# Patient Record
Sex: Female | Born: 1945 | Race: White | Hispanic: No | Smoking: Former smoker
Health system: Southern US, Community
[De-identification: ages and names within clinical notes are randomized; demographics above are authoritative.]

## PROBLEM LIST (undated history)

## (undated) DIAGNOSIS — I429 Cardiomyopathy, unspecified: Secondary | ICD-10-CM

## (undated) DIAGNOSIS — J9600 Acute respiratory failure, unspecified whether with hypoxia or hypercapnia: Secondary | ICD-10-CM

## (undated) DIAGNOSIS — I219 Acute myocardial infarction, unspecified: Secondary | ICD-10-CM

## (undated) DIAGNOSIS — I10 Essential (primary) hypertension: Secondary | ICD-10-CM

## (undated) DIAGNOSIS — I5020 Unspecified systolic (congestive) heart failure: Secondary | ICD-10-CM

---

## 2015-10-27 DIAGNOSIS — I10 Essential (primary) hypertension: Secondary | ICD-10-CM | POA: Insufficient documentation

## 2015-10-27 DIAGNOSIS — G4730 Sleep apnea, unspecified: Secondary | ICD-10-CM | POA: Insufficient documentation

## 2015-10-27 DIAGNOSIS — F329 Major depressive disorder, single episode, unspecified: Secondary | ICD-10-CM | POA: Insufficient documentation

## 2015-10-27 DIAGNOSIS — G4733 Obstructive sleep apnea (adult) (pediatric): Secondary | ICD-10-CM | POA: Insufficient documentation

## 2015-10-27 DIAGNOSIS — R5383 Other fatigue: Secondary | ICD-10-CM | POA: Insufficient documentation

## 2015-10-27 DIAGNOSIS — R1011 Right upper quadrant pain: Secondary | ICD-10-CM | POA: Insufficient documentation

## 2015-10-27 DIAGNOSIS — E1151 Type 2 diabetes mellitus with diabetic peripheral angiopathy without gangrene: Secondary | ICD-10-CM | POA: Insufficient documentation

## 2015-10-27 DIAGNOSIS — G2581 Restless legs syndrome: Secondary | ICD-10-CM | POA: Insufficient documentation

## 2015-10-27 DIAGNOSIS — E782 Mixed hyperlipidemia: Secondary | ICD-10-CM | POA: Insufficient documentation

## 2015-10-27 DIAGNOSIS — H019 Unspecified inflammation of eyelid: Secondary | ICD-10-CM | POA: Insufficient documentation

## 2015-10-27 DIAGNOSIS — M8580 Other specified disorders of bone density and structure, unspecified site: Secondary | ICD-10-CM | POA: Insufficient documentation

## 2015-10-27 DIAGNOSIS — F32A Depression, unspecified: Secondary | ICD-10-CM | POA: Insufficient documentation

## 2015-10-27 DIAGNOSIS — N3281 Overactive bladder: Secondary | ICD-10-CM | POA: Insufficient documentation

## 2016-01-11 DIAGNOSIS — G4733 Obstructive sleep apnea (adult) (pediatric): Secondary | ICD-10-CM | POA: Diagnosis not present

## 2016-01-11 DIAGNOSIS — G2581 Restless legs syndrome: Secondary | ICD-10-CM | POA: Diagnosis not present

## 2016-09-28 DIAGNOSIS — R0789 Other chest pain: Secondary | ICD-10-CM | POA: Diagnosis not present

## 2016-09-28 DIAGNOSIS — I2109 ST elevation (STEMI) myocardial infarction involving other coronary artery of anterior wall: Secondary | ICD-10-CM | POA: Diagnosis not present

## 2016-09-28 DIAGNOSIS — I10 Essential (primary) hypertension: Secondary | ICD-10-CM | POA: Diagnosis not present

## 2016-09-28 DIAGNOSIS — J069 Acute upper respiratory infection, unspecified: Secondary | ICD-10-CM | POA: Diagnosis not present

## 2016-09-28 DIAGNOSIS — J9601 Acute respiratory failure with hypoxia: Secondary | ICD-10-CM | POA: Diagnosis not present

## 2016-09-28 DIAGNOSIS — R0989 Other specified symptoms and signs involving the circulatory and respiratory systems: Secondary | ICD-10-CM | POA: Diagnosis not present

## 2016-09-28 DIAGNOSIS — R0600 Dyspnea, unspecified: Secondary | ICD-10-CM | POA: Diagnosis not present

## 2016-09-28 DIAGNOSIS — I5023 Acute on chronic systolic (congestive) heart failure: Secondary | ICD-10-CM | POA: Diagnosis not present

## 2016-09-28 DIAGNOSIS — R0602 Shortness of breath: Secondary | ICD-10-CM | POA: Diagnosis not present

## 2016-09-28 DIAGNOSIS — N179 Acute kidney failure, unspecified: Secondary | ICD-10-CM | POA: Diagnosis not present

## 2016-09-28 DIAGNOSIS — R570 Cardiogenic shock: Secondary | ICD-10-CM | POA: Diagnosis not present

## 2016-09-29 DIAGNOSIS — I5023 Acute on chronic systolic (congestive) heart failure: Secondary | ICD-10-CM | POA: Insufficient documentation

## 2016-09-29 DIAGNOSIS — K529 Noninfective gastroenteritis and colitis, unspecified: Secondary | ICD-10-CM | POA: Diagnosis present

## 2016-09-29 DIAGNOSIS — Z781 Physical restraint status: Secondary | ICD-10-CM | POA: Diagnosis not present

## 2016-09-29 DIAGNOSIS — I509 Heart failure, unspecified: Secondary | ICD-10-CM | POA: Diagnosis not present

## 2016-09-29 DIAGNOSIS — I2510 Atherosclerotic heart disease of native coronary artery without angina pectoris: Secondary | ICD-10-CM | POA: Diagnosis not present

## 2016-09-29 DIAGNOSIS — I361 Nonrheumatic tricuspid (valve) insufficiency: Secondary | ICD-10-CM | POA: Diagnosis not present

## 2016-09-29 DIAGNOSIS — R0600 Dyspnea, unspecified: Secondary | ICD-10-CM | POA: Diagnosis not present

## 2016-09-29 DIAGNOSIS — Z79899 Other long term (current) drug therapy: Secondary | ICD-10-CM | POA: Diagnosis not present

## 2016-09-29 DIAGNOSIS — E785 Hyperlipidemia, unspecified: Secondary | ICD-10-CM | POA: Diagnosis present

## 2016-09-29 DIAGNOSIS — R0602 Shortness of breath: Secondary | ICD-10-CM | POA: Diagnosis not present

## 2016-09-29 DIAGNOSIS — I2109 ST elevation (STEMI) myocardial infarction involving other coronary artery of anterior wall: Secondary | ICD-10-CM | POA: Diagnosis not present

## 2016-09-29 DIAGNOSIS — I2102 ST elevation (STEMI) myocardial infarction involving left anterior descending coronary artery: Secondary | ICD-10-CM | POA: Diagnosis not present

## 2016-09-29 DIAGNOSIS — B1910 Unspecified viral hepatitis B without hepatic coma: Secondary | ICD-10-CM | POA: Diagnosis present

## 2016-09-29 DIAGNOSIS — I447 Left bundle-branch block, unspecified: Secondary | ICD-10-CM | POA: Diagnosis not present

## 2016-09-29 DIAGNOSIS — I252 Old myocardial infarction: Secondary | ICD-10-CM | POA: Diagnosis not present

## 2016-09-29 DIAGNOSIS — I517 Cardiomegaly: Secondary | ICD-10-CM | POA: Diagnosis not present

## 2016-09-29 DIAGNOSIS — Z87891 Personal history of nicotine dependence: Secondary | ICD-10-CM | POA: Diagnosis not present

## 2016-09-29 DIAGNOSIS — E876 Hypokalemia: Secondary | ICD-10-CM | POA: Diagnosis present

## 2016-09-29 DIAGNOSIS — R918 Other nonspecific abnormal finding of lung field: Secondary | ICD-10-CM | POA: Diagnosis not present

## 2016-09-29 DIAGNOSIS — I340 Nonrheumatic mitral (valve) insufficiency: Secondary | ICD-10-CM | POA: Diagnosis not present

## 2016-09-29 DIAGNOSIS — E1159 Type 2 diabetes mellitus with other circulatory complications: Secondary | ICD-10-CM | POA: Diagnosis not present

## 2016-09-29 DIAGNOSIS — N179 Acute kidney failure, unspecified: Secondary | ICD-10-CM | POA: Diagnosis present

## 2016-09-29 DIAGNOSIS — I214 Non-ST elevation (NSTEMI) myocardial infarction: Secondary | ICD-10-CM | POA: Diagnosis not present

## 2016-09-29 DIAGNOSIS — I5021 Acute systolic (congestive) heart failure: Secondary | ICD-10-CM | POA: Diagnosis not present

## 2016-09-29 DIAGNOSIS — J9601 Acute respiratory failure with hypoxia: Secondary | ICD-10-CM | POA: Diagnosis not present

## 2016-09-29 DIAGNOSIS — I255 Ischemic cardiomyopathy: Secondary | ICD-10-CM | POA: Diagnosis not present

## 2016-09-29 DIAGNOSIS — J810 Acute pulmonary edema: Secondary | ICD-10-CM | POA: Diagnosis not present

## 2016-09-29 DIAGNOSIS — I959 Hypotension, unspecified: Secondary | ICD-10-CM | POA: Diagnosis not present

## 2016-09-29 DIAGNOSIS — Z794 Long term (current) use of insulin: Secondary | ICD-10-CM | POA: Diagnosis not present

## 2016-09-29 DIAGNOSIS — I371 Nonrheumatic pulmonary valve insufficiency: Secondary | ICD-10-CM | POA: Diagnosis not present

## 2016-09-29 DIAGNOSIS — R570 Cardiogenic shock: Secondary | ICD-10-CM | POA: Diagnosis not present

## 2016-09-29 DIAGNOSIS — I213 ST elevation (STEMI) myocardial infarction of unspecified site: Secondary | ICD-10-CM | POA: Diagnosis not present

## 2016-09-29 DIAGNOSIS — I110 Hypertensive heart disease with heart failure: Secondary | ICD-10-CM | POA: Diagnosis present

## 2016-09-29 DIAGNOSIS — G4733 Obstructive sleep apnea (adult) (pediatric): Secondary | ICD-10-CM | POA: Diagnosis not present

## 2016-09-29 DIAGNOSIS — E1165 Type 2 diabetes mellitus with hyperglycemia: Secondary | ICD-10-CM | POA: Diagnosis not present

## 2016-09-29 DIAGNOSIS — Z7982 Long term (current) use of aspirin: Secondary | ICD-10-CM | POA: Diagnosis not present

## 2016-09-29 DIAGNOSIS — E119 Type 2 diabetes mellitus without complications: Secondary | ICD-10-CM | POA: Diagnosis not present

## 2016-09-29 DIAGNOSIS — R791 Abnormal coagulation profile: Secondary | ICD-10-CM | POA: Diagnosis not present

## 2016-09-29 DIAGNOSIS — J9690 Respiratory failure, unspecified, unspecified whether with hypoxia or hypercapnia: Secondary | ICD-10-CM | POA: Diagnosis not present

## 2016-09-29 DIAGNOSIS — I10 Essential (primary) hypertension: Secondary | ICD-10-CM | POA: Diagnosis not present

## 2016-09-29 DIAGNOSIS — R579 Shock, unspecified: Secondary | ICD-10-CM | POA: Diagnosis not present

## 2016-10-11 DIAGNOSIS — J8410 Pulmonary fibrosis, unspecified: Secondary | ICD-10-CM | POA: Diagnosis not present

## 2016-10-11 DIAGNOSIS — I10 Essential (primary) hypertension: Secondary | ICD-10-CM | POA: Diagnosis not present

## 2016-10-11 DIAGNOSIS — E119 Type 2 diabetes mellitus without complications: Secondary | ICD-10-CM | POA: Diagnosis not present

## 2016-10-11 DIAGNOSIS — I447 Left bundle-branch block, unspecified: Secondary | ICD-10-CM | POA: Diagnosis not present

## 2016-10-11 DIAGNOSIS — R0600 Dyspnea, unspecified: Secondary | ICD-10-CM | POA: Diagnosis not present

## 2016-10-11 DIAGNOSIS — I509 Heart failure, unspecified: Secondary | ICD-10-CM | POA: Diagnosis not present

## 2016-10-11 DIAGNOSIS — R0602 Shortness of breath: Secondary | ICD-10-CM | POA: Diagnosis not present

## 2016-10-11 DIAGNOSIS — J9601 Acute respiratory failure with hypoxia: Secondary | ICD-10-CM | POA: Diagnosis not present

## 2016-10-11 DIAGNOSIS — I213 ST elevation (STEMI) myocardial infarction of unspecified site: Secondary | ICD-10-CM | POA: Diagnosis not present

## 2016-10-11 DIAGNOSIS — E1165 Type 2 diabetes mellitus with hyperglycemia: Secondary | ICD-10-CM | POA: Diagnosis not present

## 2016-10-11 DIAGNOSIS — I110 Hypertensive heart disease with heart failure: Secondary | ICD-10-CM | POA: Diagnosis not present

## 2016-10-12 DIAGNOSIS — E1165 Type 2 diabetes mellitus with hyperglycemia: Secondary | ICD-10-CM | POA: Diagnosis present

## 2016-10-12 DIAGNOSIS — I2109 ST elevation (STEMI) myocardial infarction involving other coronary artery of anterior wall: Secondary | ICD-10-CM | POA: Diagnosis not present

## 2016-10-12 DIAGNOSIS — I25119 Atherosclerotic heart disease of native coronary artery with unspecified angina pectoris: Secondary | ICD-10-CM | POA: Diagnosis not present

## 2016-10-12 DIAGNOSIS — J9601 Acute respiratory failure with hypoxia: Secondary | ICD-10-CM | POA: Diagnosis not present

## 2016-10-12 DIAGNOSIS — I5021 Acute systolic (congestive) heart failure: Secondary | ICD-10-CM | POA: Diagnosis not present

## 2016-10-12 DIAGNOSIS — I213 ST elevation (STEMI) myocardial infarction of unspecified site: Secondary | ICD-10-CM | POA: Diagnosis present

## 2016-10-12 DIAGNOSIS — I509 Heart failure, unspecified: Secondary | ICD-10-CM | POA: Diagnosis not present

## 2016-10-12 DIAGNOSIS — Z794 Long term (current) use of insulin: Secondary | ICD-10-CM | POA: Diagnosis not present

## 2016-10-12 DIAGNOSIS — I447 Left bundle-branch block, unspecified: Secondary | ICD-10-CM | POA: Diagnosis present

## 2016-10-12 DIAGNOSIS — I252 Old myocardial infarction: Secondary | ICD-10-CM | POA: Diagnosis not present

## 2016-10-12 DIAGNOSIS — T380X5A Adverse effect of glucocorticoids and synthetic analogues, initial encounter: Secondary | ICD-10-CM | POA: Diagnosis present

## 2016-10-12 DIAGNOSIS — E782 Mixed hyperlipidemia: Secondary | ICD-10-CM | POA: Diagnosis present

## 2016-10-12 DIAGNOSIS — I5023 Acute on chronic systolic (congestive) heart failure: Secondary | ICD-10-CM | POA: Diagnosis not present

## 2016-10-12 DIAGNOSIS — R0602 Shortness of breath: Secondary | ICD-10-CM | POA: Diagnosis not present

## 2016-10-12 DIAGNOSIS — I255 Ischemic cardiomyopathy: Secondary | ICD-10-CM | POA: Diagnosis not present

## 2016-10-12 DIAGNOSIS — Z79899 Other long term (current) drug therapy: Secondary | ICD-10-CM | POA: Diagnosis not present

## 2016-10-12 DIAGNOSIS — I2510 Atherosclerotic heart disease of native coronary artery without angina pectoris: Secondary | ICD-10-CM | POA: Diagnosis not present

## 2016-10-12 DIAGNOSIS — G4733 Obstructive sleep apnea (adult) (pediatric): Secondary | ICD-10-CM | POA: Diagnosis not present

## 2016-10-12 DIAGNOSIS — I110 Hypertensive heart disease with heart failure: Secondary | ICD-10-CM | POA: Diagnosis not present

## 2016-10-12 DIAGNOSIS — J9621 Acute and chronic respiratory failure with hypoxia: Secondary | ICD-10-CM | POA: Diagnosis not present

## 2016-10-12 DIAGNOSIS — J8410 Pulmonary fibrosis, unspecified: Secondary | ICD-10-CM | POA: Diagnosis not present

## 2016-10-12 DIAGNOSIS — R748 Abnormal levels of other serum enzymes: Secondary | ICD-10-CM | POA: Diagnosis not present

## 2016-10-12 DIAGNOSIS — Z7982 Long term (current) use of aspirin: Secondary | ICD-10-CM | POA: Diagnosis not present

## 2016-10-12 DIAGNOSIS — Z87891 Personal history of nicotine dependence: Secondary | ICD-10-CM | POA: Diagnosis not present

## 2016-10-12 DIAGNOSIS — I5030 Unspecified diastolic (congestive) heart failure: Secondary | ICD-10-CM | POA: Diagnosis not present

## 2016-10-17 DIAGNOSIS — I509 Heart failure, unspecified: Secondary | ICD-10-CM | POA: Diagnosis not present

## 2016-10-24 DIAGNOSIS — E1100 Type 2 diabetes mellitus with hyperosmolarity without nonketotic hyperglycemic-hyperosmolar coma (NKHHC): Secondary | ICD-10-CM | POA: Diagnosis not present

## 2016-10-24 DIAGNOSIS — I255 Ischemic cardiomyopathy: Secondary | ICD-10-CM | POA: Diagnosis not present

## 2016-10-24 DIAGNOSIS — I509 Heart failure, unspecified: Secondary | ICD-10-CM | POA: Diagnosis not present

## 2016-10-24 DIAGNOSIS — I2101 ST elevation (STEMI) myocardial infarction involving left main coronary artery: Secondary | ICD-10-CM | POA: Diagnosis not present

## 2016-10-24 DIAGNOSIS — I5021 Acute systolic (congestive) heart failure: Secondary | ICD-10-CM | POA: Diagnosis not present

## 2016-10-25 DIAGNOSIS — I10 Essential (primary) hypertension: Secondary | ICD-10-CM | POA: Diagnosis not present

## 2016-10-25 DIAGNOSIS — G4733 Obstructive sleep apnea (adult) (pediatric): Secondary | ICD-10-CM | POA: Diagnosis not present

## 2016-10-25 DIAGNOSIS — E782 Mixed hyperlipidemia: Secondary | ICD-10-CM | POA: Diagnosis not present

## 2016-10-25 DIAGNOSIS — I5021 Acute systolic (congestive) heart failure: Secondary | ICD-10-CM | POA: Diagnosis not present

## 2016-11-07 DIAGNOSIS — R739 Hyperglycemia, unspecified: Secondary | ICD-10-CM | POA: Diagnosis not present

## 2016-11-07 DIAGNOSIS — Z955 Presence of coronary angioplasty implant and graft: Secondary | ICD-10-CM | POA: Diagnosis not present

## 2016-11-09 DIAGNOSIS — Z955 Presence of coronary angioplasty implant and graft: Secondary | ICD-10-CM | POA: Diagnosis not present

## 2016-11-11 DIAGNOSIS — J479 Bronchiectasis, uncomplicated: Secondary | ICD-10-CM | POA: Diagnosis not present

## 2016-11-11 DIAGNOSIS — Z955 Presence of coronary angioplasty implant and graft: Secondary | ICD-10-CM | POA: Diagnosis not present

## 2016-11-11 DIAGNOSIS — J84112 Idiopathic pulmonary fibrosis: Secondary | ICD-10-CM | POA: Diagnosis not present

## 2016-11-16 DIAGNOSIS — Z955 Presence of coronary angioplasty implant and graft: Secondary | ICD-10-CM | POA: Diagnosis not present

## 2016-11-18 DIAGNOSIS — Z955 Presence of coronary angioplasty implant and graft: Secondary | ICD-10-CM | POA: Diagnosis not present

## 2016-11-21 DIAGNOSIS — I5021 Acute systolic (congestive) heart failure: Secondary | ICD-10-CM | POA: Diagnosis not present

## 2016-11-21 DIAGNOSIS — Z955 Presence of coronary angioplasty implant and graft: Secondary | ICD-10-CM | POA: Diagnosis not present

## 2016-11-21 DIAGNOSIS — I2102 ST elevation (STEMI) myocardial infarction involving left anterior descending coronary artery: Secondary | ICD-10-CM | POA: Diagnosis not present

## 2016-11-21 DIAGNOSIS — I255 Ischemic cardiomyopathy: Secondary | ICD-10-CM | POA: Diagnosis not present

## 2016-11-21 DIAGNOSIS — I10 Essential (primary) hypertension: Secondary | ICD-10-CM | POA: Diagnosis not present

## 2016-11-22 DIAGNOSIS — F329 Major depressive disorder, single episode, unspecified: Secondary | ICD-10-CM | POA: Diagnosis not present

## 2016-11-22 DIAGNOSIS — G2581 Restless legs syndrome: Secondary | ICD-10-CM | POA: Diagnosis not present

## 2016-11-22 DIAGNOSIS — E1169 Type 2 diabetes mellitus with other specified complication: Secondary | ICD-10-CM | POA: Diagnosis not present

## 2016-11-22 DIAGNOSIS — R609 Edema, unspecified: Secondary | ICD-10-CM | POA: Diagnosis not present

## 2016-11-23 DIAGNOSIS — Z955 Presence of coronary angioplasty implant and graft: Secondary | ICD-10-CM | POA: Diagnosis not present

## 2016-11-23 DIAGNOSIS — R911 Solitary pulmonary nodule: Secondary | ICD-10-CM | POA: Diagnosis not present

## 2016-11-25 DIAGNOSIS — Z23 Encounter for immunization: Secondary | ICD-10-CM | POA: Diagnosis not present

## 2016-11-25 DIAGNOSIS — R0989 Other specified symptoms and signs involving the circulatory and respiratory systems: Secondary | ICD-10-CM | POA: Diagnosis not present

## 2016-11-25 DIAGNOSIS — Z955 Presence of coronary angioplasty implant and graft: Secondary | ICD-10-CM | POA: Diagnosis not present

## 2016-11-25 DIAGNOSIS — I739 Peripheral vascular disease, unspecified: Secondary | ICD-10-CM | POA: Diagnosis not present

## 2016-11-28 DIAGNOSIS — Z955 Presence of coronary angioplasty implant and graft: Secondary | ICD-10-CM | POA: Diagnosis not present

## 2016-11-28 DIAGNOSIS — I739 Peripheral vascular disease, unspecified: Secondary | ICD-10-CM | POA: Diagnosis not present

## 2016-11-30 DIAGNOSIS — Z955 Presence of coronary angioplasty implant and graft: Secondary | ICD-10-CM | POA: Diagnosis not present

## 2016-11-30 DIAGNOSIS — I739 Peripheral vascular disease, unspecified: Secondary | ICD-10-CM | POA: Diagnosis not present

## 2016-12-03 DIAGNOSIS — Z1231 Encounter for screening mammogram for malignant neoplasm of breast: Secondary | ICD-10-CM | POA: Diagnosis not present

## 2016-12-05 DIAGNOSIS — I252 Old myocardial infarction: Secondary | ICD-10-CM | POA: Diagnosis not present

## 2016-12-05 DIAGNOSIS — I745 Embolism and thrombosis of iliac artery: Secondary | ICD-10-CM | POA: Diagnosis not present

## 2016-12-05 DIAGNOSIS — M79604 Pain in right leg: Secondary | ICD-10-CM | POA: Diagnosis not present

## 2016-12-05 DIAGNOSIS — E119 Type 2 diabetes mellitus without complications: Secondary | ICD-10-CM | POA: Diagnosis not present

## 2016-12-05 DIAGNOSIS — I7090 Unspecified atherosclerosis: Secondary | ICD-10-CM | POA: Diagnosis not present

## 2016-12-05 DIAGNOSIS — Z79899 Other long term (current) drug therapy: Secondary | ICD-10-CM | POA: Diagnosis not present

## 2016-12-05 DIAGNOSIS — I739 Peripheral vascular disease, unspecified: Secondary | ICD-10-CM | POA: Diagnosis not present

## 2016-12-05 DIAGNOSIS — E785 Hyperlipidemia, unspecified: Secondary | ICD-10-CM | POA: Diagnosis not present

## 2016-12-05 DIAGNOSIS — I10 Essential (primary) hypertension: Secondary | ICD-10-CM | POA: Diagnosis not present

## 2016-12-09 DIAGNOSIS — Z955 Presence of coronary angioplasty implant and graft: Secondary | ICD-10-CM | POA: Diagnosis not present

## 2016-12-09 DIAGNOSIS — I739 Peripheral vascular disease, unspecified: Secondary | ICD-10-CM | POA: Diagnosis not present

## 2017-01-16 DIAGNOSIS — I2510 Atherosclerotic heart disease of native coronary artery without angina pectoris: Secondary | ICD-10-CM | POA: Diagnosis not present

## 2017-01-16 DIAGNOSIS — Z780 Asymptomatic menopausal state: Secondary | ICD-10-CM | POA: Diagnosis not present

## 2017-01-16 DIAGNOSIS — I10 Essential (primary) hypertension: Secondary | ICD-10-CM | POA: Diagnosis not present

## 2017-01-16 DIAGNOSIS — E7800 Pure hypercholesterolemia, unspecified: Secondary | ICD-10-CM | POA: Diagnosis not present

## 2017-01-16 DIAGNOSIS — E118 Type 2 diabetes mellitus with unspecified complications: Secondary | ICD-10-CM | POA: Diagnosis not present

## 2017-01-30 DIAGNOSIS — E782 Mixed hyperlipidemia: Secondary | ICD-10-CM | POA: Diagnosis not present

## 2017-01-30 DIAGNOSIS — I252 Old myocardial infarction: Secondary | ICD-10-CM | POA: Diagnosis not present

## 2017-01-30 DIAGNOSIS — I25119 Atherosclerotic heart disease of native coronary artery with unspecified angina pectoris: Secondary | ICD-10-CM | POA: Diagnosis not present

## 2017-01-30 DIAGNOSIS — I255 Ischemic cardiomyopathy: Secondary | ICD-10-CM | POA: Diagnosis not present

## 2017-02-06 DIAGNOSIS — E118 Type 2 diabetes mellitus with unspecified complications: Secondary | ICD-10-CM | POA: Diagnosis not present

## 2017-02-06 DIAGNOSIS — Z794 Long term (current) use of insulin: Secondary | ICD-10-CM | POA: Diagnosis not present

## 2017-02-06 DIAGNOSIS — I10 Essential (primary) hypertension: Secondary | ICD-10-CM | POA: Diagnosis not present

## 2017-02-07 DIAGNOSIS — I25119 Atherosclerotic heart disease of native coronary artery with unspecified angina pectoris: Secondary | ICD-10-CM | POA: Diagnosis not present

## 2017-02-07 DIAGNOSIS — I255 Ischemic cardiomyopathy: Secondary | ICD-10-CM | POA: Diagnosis not present

## 2017-02-13 DIAGNOSIS — I739 Peripheral vascular disease, unspecified: Secondary | ICD-10-CM | POA: Diagnosis not present

## 2017-02-13 DIAGNOSIS — I255 Ischemic cardiomyopathy: Secondary | ICD-10-CM | POA: Diagnosis not present

## 2017-02-13 DIAGNOSIS — E118 Type 2 diabetes mellitus with unspecified complications: Secondary | ICD-10-CM | POA: Diagnosis not present

## 2017-02-13 DIAGNOSIS — I25119 Atherosclerotic heart disease of native coronary artery with unspecified angina pectoris: Secondary | ICD-10-CM | POA: Diagnosis not present

## 2017-02-16 DIAGNOSIS — I739 Peripheral vascular disease, unspecified: Secondary | ICD-10-CM | POA: Diagnosis not present

## 2017-02-16 DIAGNOSIS — I25119 Atherosclerotic heart disease of native coronary artery with unspecified angina pectoris: Secondary | ICD-10-CM | POA: Diagnosis not present

## 2017-02-20 DIAGNOSIS — I2510 Atherosclerotic heart disease of native coronary artery without angina pectoris: Secondary | ICD-10-CM | POA: Diagnosis present

## 2017-02-20 DIAGNOSIS — I255 Ischemic cardiomyopathy: Secondary | ICD-10-CM

## 2017-02-20 DIAGNOSIS — I739 Peripheral vascular disease, unspecified: Secondary | ICD-10-CM | POA: Diagnosis present

## 2017-02-20 NOTE — H&P (Signed)
OFFICE VISIT NOTES COPIED TO EPIC FOR DOCUMENTATION  . History of Present Illness Laverda Page MD; February 27, 2017 6:04 PM) Patient words: Last O/V 01/30/2017; 2 week F/U for medication titration and echo results.  The patient is a 71 year old female who presents for a follow-up for Coronary artery disease. She recently moved to [place] from [place] and established with me 3 weeks ago. She has a history of uncontrolled diabetes with insulin use, HTN, and HLD. She is a former smoker and quit in 1991 with 25 year smoking history. Family history significant for CAD in both mother and father.  She has known CAD with prior STEMI in 09/2016 When she presented with severe dyspnea as her STEMI presentation, found to have severely diffusely diseased multivessel disease, underwent stenting to her LAD with overlapping 4 drug-eluting stents. She also had Left circumflex and 2nd RPLB lesion were to small for PCI and advised to treat medically. LVEF 25% with Takotsubo appearance. Due to severe LV systolic dysfunction as per recommendation, she underwent repeat echocardiogram 3 months after her angioplasty. She now presents here for follow-up. States she is currently doing well and denies chest pain, SOB, leg edema.  She also states claudication symptoms right worse than left with hip down pain even with minimal activity ans states that the symptoms are worse recently especially since cardiac cath done via femoral approach. She is very frustrated about this and requests a handicap sticker.    Problem List/Past Medical (April Garrison; 27-Feb-2017 10:58 AM) Atherosclerosis of native coronary artery of native heart with angina pectoris (I25.119)  Coronary angiogram performed in [place]: 09/29/2016: Severe diffuse triple-vessel coronary disease, small calibered vessels. Distal circumflex 80%, distal PL branch 80%, LAD 90% stenosis, successful angioplasty with placement of 2 overlapping 2.5 x 8 mm Xience DES. Mid to  distal anteroapical and anterior and inferoapical dyskinesis, LVEF 25%. Labwork  Labs 01/14/2017: HB 12.1/HCT 35.7, platelets 296. BUN 24, creatinine 1.1, eGFR 53 mL, potassium 4.2. CMP otherwise normal. Total cholesterol 116, triglycerides 99, HDL 40, LDL 56. A1c 8.4%. Uncontrolled type 2 diabetes mellitus with complication, with long-term current use of insulin (E11.8, E11.65)  Benign essential hypertension (I10)  Ischemic cardiomyopathy (I25.5)  Echocardiogram 02/07/2017: Left ventricle cavity is normal in size. Mild concentric hypertrophy of the left ventricle. There is severe anterior, antero septal, apical and apical lateral hypokinesis. Severe hypokinesis of the mid to distal infero-lateral wall. Mild generalized hypokinesis. Visual EF is 30-35%. Doppler evidence of grade I (impaired) diastolic dysfunction. Left atrial cavity is normal in size. An aneurysm with a patent foramen ovale is present with left to right shunting at rest. Moderate (Grade II) mitral regurgitation. Moderate tricuspid regurgitation. Mild pulmonary hypertension. Pulmonary artery systolic pressure is estimated at 37 mm Hg. IVC is dilated with respiratory variation. Suggests elevated central venous pressure. PAD (peripheral artery disease) (I73.9)  Hyperlipidemia, mixed (E78.2)  CHF (congestive heart failure) (I50.9)  COPD (chronic obstructive pulmonary disease) (J44.9)  OSA on CPAP (G47.33)  H/O myocardial infarction, greater than 8 weeks (I25.2) 09/29/2016  Allergies (April Garrison; 02-27-2017 10:58 AM) Penicillins  unknown reaction Sulfa Drugs  unknown reaction Percodan *ANALGESICS - OPIOID*  unknown reaction EPINEPHrine *VASOPRESSORS*  Palipitations  Family History (April Garrison; Feb 27, 2017 10:58 AM) Mother  Deceased. at age 45 from [id number]:[id number] age of first MI: Cancer Father  Deceased. at age 65 from MI;Angioplasty Siblings  [place]  Social History (April Garrison; 2017/02/27 10:58 AM) Current  tobacco use  Former smoker. quit 1993 Non  Drinker/No Alcohol Use  Marital status  Widowed. Number of Children  4. Living Situation  Lives with relatives. Lives with Son & Family  Past Surgical History (April Garrison; 02/13/2017 10:58 AM) Tonsillectomy 1956 Cesarean Delivery 1982 Tubal Ligation 1984 Lumpectomy 2005 Left. benign Arthroscopic Knee Surgery - Right 10/28/2008 Heart Cath 09/29/2016  Medication History (April Garrison; 02/13/2017 11:06 AM) Kary Kos (90MG Tablet, 1 Tablet Oral every 12 hours, Taken starting 01/30/2017) Active. Carvedilol (6.25MG Tablet, 1 Tablet Oral two times daily, Taken starting 01/30/2017) Active. Atorvastatin Calcium (80MG Tablet, 1 Tablet Oral daily, Taken starting 01/30/2017) Active. Furosemide (40MG Tablet, 1 Tablet Oral every morning, Taken starting 01/30/2017) Active. Entresto (49-51MG Tablet, 1 (one) Tablet Oral two times daily, Taken starting 01/30/2017) Active. Entresto (24-26MG Tablet, 1 (one) Tablet Oral two times daily, Taken starting 01/30/2017) Active. Aspirin (81MG Tablet DR, 1 Oral daily) Active. Lantus (100UNIT/ML Solution, inject 30units Subcutaneous twice a day) Active. Nitroglycerin (0.4MG Tab Sublingual, 1 Sublingual every 5 minutes as needed for chest pain.as needed) Active. Requip (2MG Tablet, 1 Oral daily) Active. Zoloft (100MG Tablet, 1 Oral daily) Active. VESIcare (5MG Tablet, 1 Oral daily) Active. Trulicity (4.4RX/5.4MG Soln Pen-inj, 1 Subcutaneous weekly) Active. Medications Reconciled (Verbally with patient & list present)  Diagnostic Studies History Laverda Page, MD; 02/13/2017 11:33 AM) Echocardiogram 02/07/2017 Left ventricle cavity is normal in size. Mild concentric hypertrophy of the left ventricle. There is severe anterior, antero septal, apical and apical lateral hypokinesis. Severe hypokinesis of the mid to distal infero-lateral wall. Mild generalized hypokinesis. Visual EF is 30-35%.  Doppler evidence of grade I (impaired) diastolic dysfunction. Left atrial cavity is normal in size. An aneurysm with a patent foramen ovale is present with left to right shunting at rest. Moderate (Grade II) mitral regurgitation. Moderate tricuspid regurgitation. Mild pulmonary hypertension. Pulmonary artery systolic pressure is estimated at 37 mm Hg. IVC is dilated with respiratory variation. Suggests elevated central venous pressure. Carotid Doppler 11/22/2016 No significant abnormal vascular plugging of the arteries supplying the brain Coronary Angiogram 09/28/2016 Coronary angiogram performed in [place]: 09/29/2016: Severe diffuse triple-vessel coronary disease, small calibered vessels. Distal circumflex 80%, distal PL branch 80%, LAD 90% stenosis, successful angioplasty with placement of 2.25 x 12, 2.25 x 23, 2.5 x 33 and 2.5 x 8 overlapping Xience DES. Mid to distal anteroapical and anterior and inferoapical dyskinesis, LVEF 25%. CT Scan  CT angiogram abdomen with distal runoff: 12/05/2016: Normal abdominal aorta with moderate atherosclerotic calcification, mild noncalcific plaque right renal artery, left renal artery widely patent. No hemodynamically stenosis. 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 Severe atherosclerotic back with calcification is present. Atherosclerotic plaque common iliac artery with less than 50% stenosis. Moderate calcification present. Three-vessel runoff below the knee, femoral artery shows diffuse disease without high-grade stenosis bilaterally. ABI  ABI 12/05/2016 with and without exercise: Reveals proximal vessel stenosis proximal to the femoral. Right ABI consistent with moderate disease, left ABI mild disease, abnormal treadmill exercise response, severe arterial disease on the right and mild on the left. Details not available.    Review of Systems Laverda Page MD; 02/13/2017 6:04 PM) General Not Present- Appetite Loss and  Weight Gain. Respiratory Present- Difficulty Breathing on Exertion (stable adn chronic). Not Present- Chronic Cough and Wakes up from Sleep Wheezing or Short of Breath. Cardiovascular Present- Calf Cramps and Claudications. Gastrointestinal Not Present- Black, Tarry Stool and Difficulty Swallowing. Musculoskeletal Not Present- Decreased Range of Motion and Muscle Atrophy. Neurological Not Present- Attention Deficit. Psychiatric  Not Present- Personality Changes and Suicidal Ideation. Endocrine Not Present- Cold Intolerance and Heat Intolerance. Hematology Not Present- Abnormal Bleeding. All other systems negative  Vitals (April Garrison; 02/13/2017 11:08 AM) 02/13/2017 11:01 AM Weight: 207.31 lb Height: 61in Body Surface Area: 1.92 m Body Mass Index: 39.17 kg/m  Pulse: 74 (Regular)  P.OX: 99% (Room air) BP: 112/60 (Sitting, Left Arm, Standard)       Physical Exam Laverda Page, MD; 02/13/2017 6:8 PM) General Mental Status-Alert. General Appearance-Cooperative, Appears stated age, Not in acute distress. Build & Nutrition-Well built and Morbidly obese.  Head and Neck Neck -Note: Short neck and difficult to evaluate JVD.  Thyroid Gland Characteristics - no palpable nodules, no palpable enlargement.  Cardiovascular Cardiovascular examination reveals -normal heart sounds, regular rate and rhythm with no murmurs. Inspection Jugular vein - Right - No Distention.  Abdomen Inspection Contour - Obese. Palpation/Percussion Normal exam - Non Tender and No hepatosplenomegaly. Auscultation Normal exam - Bowel sounds normal.  Peripheral Vascular Lower Extremity Inspection - Bilateral - Inspection Normal. Palpation - Temperature - Bilateral - Warm. Edema - Bilateral - No edema. Femoral pulse - Bilateral - 1+, No Bruits. Popliteal pulse - Bilateral - Absent. Dorsalis pedis pulse - Bilateral - Absent. Posterior tibial pulse - Bilateral - Absent. Carotid  arteries - Bilateral-No Carotid bruit.  Neurologic Neurologic evaluation reveals -alert and oriented x 3 with no impairment of recent or remote memory. Motor-Grossly intact without any focal deficits.  Musculoskeletal Global Assessment Left Lower Extremity - normal range of motion without pain. Right Lower Extremity - normal range of motion without pain.    Assessment & Plan Laverda Page MD; 02/13/2017 6:07 PM) Atherosclerosis of native coronary artery of native heart with angina pectoris (I25.119) Story: Coronary angiogram performed in [place]: 09/29/2016: Severe diffuse triple-vessel coronary disease, small calibered vessels. Distal circumflex 80%, distal PL branch 80%, LAD 90% stenosis, successful angioplasty with placement of 2.25 x 12, 2.25 x 23, 2.5 x 33 and 2.5 x 8 overlapping Xience DES. Mid to distal anteroapical and anterior and inferoapical dyskinesis, LVEF 25%. Impression: EKG 01/30/2017: Normal sinus rhythm at rate of 76 bpm, left bundle branch block. No further analysis due to LBBB. Low-voltage complexes. Current Plans PT (PROTHROMBIN TIME) (82423) Ischemic cardiomyopathy (I25.5) Story: Echocardiogram 02/07/2017: Left ventricle cavity is normal in size. Mild concentric hypertrophy of the left ventricle. There is severe anterior, antero septal, apical and apical lateral hypokinesis. Severe hypokinesis of the mid to distal infero-lateral wall. Mild generalized hypokinesis. Visual EF is 30-35%. Doppler evidence of grade I (impaired) diastolic dysfunction. Left atrial cavity is normal in size. An aneurysm with a patent foramen ovale is present with left to right shunting at rest. Moderate (Grade II) mitral regurgitation. Moderate tricuspid regurgitation. Mild pulmonary hypertension. Pulmonary artery systolic pressure is estimated at 37 mm Hg. IVC is dilated with respiratory variation. Suggests elevated central venous pressure. Current Plans Changed Carvedilol 12.5MG, 1  Tablet two times daily, #180, 02/13/2017, Ref. x1. Uncontrolled type 2 diabetes mellitus with complication, with long-term current use of insulin (E11.8) Benign essential hypertension (I10) Labwork Story: Labs 01/14/2017: HB 12.1/HCT 35.7, platelets 296. BUN 24, creatinine 1.1, eGFR 53 mL, potassium 4.2. CMP otherwise normal. Total cholesterol 116, triglycerides 99, HDL 40, LDL 56. A1c 8.4%. PAD (peripheral artery disease) (I73.9) Story: ABI 12/05/2016 with and without exercise: Reveals proximal vessel stenosis proximal to the femoral. Right ABI consistent with moderate disease, left ABI mild disease, abnormal treadmill exercise response, severe arterial disease on the right  and mild on the left. Details not available.  CT angiogram abdomen with distal runoff: 12/05/2016: Normal abdominal aorta with moderate atherosclerotic calcification, mild noncalcific plaque right renal artery, left renal artery widely patent. No hemodynamically stenosis. 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 Severe atherosclerotic back with calcification is present. Atherosclerotic plaque common iliac artery with less than 50% stenosis. Moderate calcification present. Three-vessel runoff below the knee, femoral artery shows diffuse disease without high-grade stenosis bilaterally. Current Plans METABOLIC PANEL, BASIC (69629) CBC & PLATELETS (AUTO) (52841) Future Plans 02/28/2017: ABI (ankle brachial index) (32440) - one time Hyperlipidemia, mixed (E78.2) Current Plans Mechanism of underlying disease process and action of medications discussed with the patient. I also discussed primary/secondary prevention and dietary counseling was done. Extremely complex patient with severe coronary artery disease and ischemic myopathy. I will discuss findings of the echocardiogram the patient, advised her that she may need further evaluation for possible ICD for primary prevention of sudden cardiac.  Clinically she appears to be doing well, I'll continue to titrate Coreg and interested to the maximum dose and probably consider repeating echocardiogram in 3 months, if EF continues to be low, would have a low threshold for referring her for ICD implantation. I have increased the dose of Coreg from 6.25 mg to 12.5 g b.i.d., on her next office visit I'll consider increasing the Entresto.  Again discussed regarding diabetes mellitus which is uncontrolled and hyperlipidemia.  With regard to peripheral arterial disease, she is very frustrated about inability to do even minimal lack is around the house. I will set her up for peripheral arteriogram and possible angioplasty. Needs to schedule for peripheral arteriogram and possible angioplasty given symptoms. Patient understands the risks, benefits, alternatives including medical therapy, CT angiography. Patient understands <1-2% risk of death, embolic complications, bleeding, infection, renal failure, urgent surgical revascularization, but not limited to these and wants to proceed.  CC Dr. Jani Gravel.    Signed by Laverda Page, MD (02/13/2017 6:08 PM)

## 2017-02-21 ENCOUNTER — Encounter (HOSPITAL_COMMUNITY)
Admission: RE | Disposition: A | Discharge status: Home / Self Care | Payer: Self-pay | Source: Ambulatory Visit | Attending: Cardiology

## 2017-02-21 ENCOUNTER — Ambulatory Visit (HOSPITAL_COMMUNITY)
Admission: RE | Admit: 2017-02-21 | Discharge: 2017-02-21 | Disposition: A | Discharge status: Home / Self Care | Payer: Medicare Other | Source: Ambulatory Visit | Attending: Cardiology | Admitting: Cardiology

## 2017-02-21 DIAGNOSIS — Z87891 Personal history of nicotine dependence: Secondary | ICD-10-CM | POA: Diagnosis not present

## 2017-02-21 DIAGNOSIS — I447 Left bundle-branch block, unspecified: Secondary | ICD-10-CM | POA: Diagnosis not present

## 2017-02-21 DIAGNOSIS — I252 Old myocardial infarction: Secondary | ICD-10-CM | POA: Insufficient documentation

## 2017-02-21 DIAGNOSIS — Z794 Long term (current) use of insulin: Secondary | ICD-10-CM | POA: Insufficient documentation

## 2017-02-21 DIAGNOSIS — I081 Rheumatic disorders of both mitral and tricuspid valves: Secondary | ICD-10-CM | POA: Diagnosis not present

## 2017-02-21 DIAGNOSIS — G4733 Obstructive sleep apnea (adult) (pediatric): Secondary | ICD-10-CM | POA: Diagnosis not present

## 2017-02-21 DIAGNOSIS — E1151 Type 2 diabetes mellitus with diabetic peripheral angiopathy without gangrene: Secondary | ICD-10-CM | POA: Diagnosis not present

## 2017-02-21 DIAGNOSIS — E782 Mixed hyperlipidemia: Secondary | ICD-10-CM | POA: Insufficient documentation

## 2017-02-21 DIAGNOSIS — I739 Peripheral vascular disease, unspecified: Secondary | ICD-10-CM | POA: Diagnosis present

## 2017-02-21 DIAGNOSIS — I272 Pulmonary hypertension, unspecified: Secondary | ICD-10-CM | POA: Diagnosis not present

## 2017-02-21 DIAGNOSIS — Q211 Atrial septal defect: Secondary | ICD-10-CM | POA: Insufficient documentation

## 2017-02-21 DIAGNOSIS — I255 Ischemic cardiomyopathy: Secondary | ICD-10-CM | POA: Diagnosis not present

## 2017-02-21 DIAGNOSIS — I7092 Chronic total occlusion of artery of the extremities: Secondary | ICD-10-CM | POA: Insufficient documentation

## 2017-02-21 DIAGNOSIS — I110 Hypertensive heart disease with heart failure: Secondary | ICD-10-CM | POA: Insufficient documentation

## 2017-02-21 DIAGNOSIS — I700 Atherosclerosis of aorta: Secondary | ICD-10-CM | POA: Insufficient documentation

## 2017-02-21 DIAGNOSIS — Z8249 Family history of ischemic heart disease and other diseases of the circulatory system: Secondary | ICD-10-CM | POA: Diagnosis not present

## 2017-02-21 DIAGNOSIS — Z6839 Body mass index (BMI) 39.0-39.9, adult: Secondary | ICD-10-CM | POA: Insufficient documentation

## 2017-02-21 DIAGNOSIS — Z955 Presence of coronary angioplasty implant and graft: Secondary | ICD-10-CM | POA: Insufficient documentation

## 2017-02-21 DIAGNOSIS — I2510 Atherosclerotic heart disease of native coronary artery without angina pectoris: Secondary | ICD-10-CM | POA: Diagnosis present

## 2017-02-21 DIAGNOSIS — I5042 Chronic combined systolic (congestive) and diastolic (congestive) heart failure: Secondary | ICD-10-CM | POA: Diagnosis not present

## 2017-02-21 DIAGNOSIS — J449 Chronic obstructive pulmonary disease, unspecified: Secondary | ICD-10-CM | POA: Insufficient documentation

## 2017-02-21 DIAGNOSIS — E1165 Type 2 diabetes mellitus with hyperglycemia: Secondary | ICD-10-CM | POA: Insufficient documentation

## 2017-02-21 DIAGNOSIS — I70213 Atherosclerosis of native arteries of extremities with intermittent claudication, bilateral legs: Secondary | ICD-10-CM | POA: Diagnosis not present

## 2017-02-21 DIAGNOSIS — I25118 Atherosclerotic heart disease of native coronary artery with other forms of angina pectoris: Secondary | ICD-10-CM | POA: Diagnosis not present

## 2017-02-21 DIAGNOSIS — Z7982 Long term (current) use of aspirin: Secondary | ICD-10-CM | POA: Insufficient documentation

## 2017-02-21 HISTORY — PX: LOWER EXTREMITY ANGIOGRAPHY: CATH118251

## 2017-02-21 HISTORY — PX: PERIPHERAL VASCULAR INTERVENTION: CATH118257

## 2017-02-21 HISTORY — PX: ABDOMINAL AORTOGRAM: CATH118222

## 2017-02-21 LAB — POCT ACTIVATED CLOTTING TIME
ACTIVATED CLOTTING TIME: 191 s
Activated Clotting Time: 158 seconds

## 2017-02-21 LAB — POCT I-STAT, CHEM 8
BUN: 27 mg/dL — ABNORMAL HIGH (ref 6–20)
Calcium, Ion: 1.2 mmol/L (ref 1.15–1.40)
Chloride: 105 mmol/L (ref 101–111)
Creatinine, Ser: 0.9 mg/dL (ref 0.44–1.00)
GLUCOSE: 88 mg/dL (ref 65–99)
HCT: 30 % — ABNORMAL LOW (ref 36.0–46.0)
HEMOGLOBIN: 10.2 g/dL — AB (ref 12.0–15.0)
POTASSIUM: 3.1 mmol/L — AB (ref 3.5–5.1)
Sodium: 143 mmol/L (ref 135–145)
TCO2: 27 mmol/L (ref 0–100)

## 2017-02-21 LAB — GLUCOSE, CAPILLARY: GLUCOSE-CAPILLARY: 76 mg/dL (ref 65–99)

## 2017-02-21 SURGERY — LOWER EXTREMITY ANGIOGRAPHY
Anesthesia: LOCAL

## 2017-02-21 MED ORDER — ALUM & MAG HYDROXIDE-SIMETH 200-200-20 MG/5ML PO SUSP
15.0000 mL | ORAL | Status: DC | PRN
  Filled 2017-02-21: qty 30

## 2017-02-21 MED ORDER — HEPARIN SODIUM (PORCINE) 1000 UNIT/ML IJ SOLN
INTRAMUSCULAR | Status: DC | PRN
  Administered 2017-02-21: 1000 [IU] via INTRAVENOUS
  Administered 2017-02-21: 4000 [IU] via INTRAVENOUS

## 2017-02-21 MED ORDER — SODIUM CHLORIDE 0.9 % IV SOLN: 1.0000 mL/kg/h | INTRAVENOUS | Status: DC

## 2017-02-21 MED ORDER — PHENOL 1.4 % MT LIQD: 1.0000 | OROMUCOSAL | Status: DC | PRN

## 2017-02-21 MED ORDER — HYDROMORPHONE HCL 1 MG/ML IJ SOLN
INTRAMUSCULAR | Status: DC | PRN
  Administered 2017-02-21 (×3): 0.5 mg via INTRAVENOUS

## 2017-02-21 MED ORDER — IOPAMIDOL (ISOVUE-370) INJECTION 76%
INTRAVENOUS | Status: AC
  Filled 2017-02-21: qty 100

## 2017-02-21 MED ORDER — SODIUM CHLORIDE 0.9 % IV SOLN
INTRAVENOUS | Status: DC
  Administered 2017-02-21: 500 mL via INTRAVENOUS

## 2017-02-21 MED ORDER — LABETALOL HCL 5 MG/ML IV SOLN: 10.0000 mg | INTRAVENOUS | Status: DC | PRN

## 2017-02-21 MED ORDER — LIDOCAINE HCL (PF) 1 % IJ SOLN
INTRAMUSCULAR | Status: DC | PRN
  Administered 2017-02-21: 10 mL via INTRADERMAL
  Administered 2017-02-21: 20 mL via INTRADERMAL

## 2017-02-21 MED ORDER — ACETAMINOPHEN 325 MG PO TABS
325.0000 mg | ORAL_TABLET | ORAL | Status: DC | PRN
  Filled 2017-02-21: qty 2

## 2017-02-21 MED ORDER — VANCOMYCIN HCL IN DEXTROSE 1-5 GM/200ML-% IV SOLN
INTRAVENOUS | Status: AC
  Filled 2017-02-21: qty 200

## 2017-02-21 MED ORDER — IOPAMIDOL (ISOVUE-370) INJECTION 76%
INTRAVENOUS | Status: AC
  Filled 2017-02-21: qty 50

## 2017-02-21 MED ORDER — HEPARIN (PORCINE) IN NACL 2-0.9 UNIT/ML-% IJ SOLN
INTRAMUSCULAR | Status: DC | PRN
  Administered 2017-02-21: 1000 mL via INTRA_ARTERIAL

## 2017-02-21 MED ORDER — GUAIFENESIN-DM 100-10 MG/5ML PO SYRP
15.0000 mL | ORAL_SOLUTION | ORAL | Status: DC | PRN
  Filled 2017-02-21: qty 15

## 2017-02-21 MED ORDER — HYDROMORPHONE HCL 1 MG/ML IJ SOLN
INTRAMUSCULAR | Status: AC
  Filled 2017-02-21: qty 1

## 2017-02-21 MED ORDER — SODIUM CHLORIDE 0.9 % IV SOLN: 250.0000 mL | INTRAVENOUS | Status: DC | PRN

## 2017-02-21 MED ORDER — PANTOPRAZOLE SODIUM 40 MG PO TBEC
40.0000 mg | DELAYED_RELEASE_TABLET | Freq: Every day | ORAL | Status: DC
  Filled 2017-02-21: qty 1

## 2017-02-21 MED ORDER — MIDAZOLAM HCL 2 MG/2ML IJ SOLN
INTRAMUSCULAR | Status: DC | PRN
  Administered 2017-02-21: 2 mg via INTRAVENOUS

## 2017-02-21 MED ORDER — SODIUM CHLORIDE 0.9% FLUSH: 3.0000 mL | Freq: Two times a day (BID) | INTRAVENOUS | Status: DC

## 2017-02-21 MED ORDER — SODIUM CHLORIDE 0.9 % IV BOLUS (SEPSIS): 1000.0000 mL | Freq: Every day | INTRAVENOUS | Status: DC

## 2017-02-21 MED ORDER — HEPARIN (PORCINE) IN NACL 2-0.9 UNIT/ML-% IJ SOLN
INTRAMUSCULAR | Status: AC
  Filled 2017-02-21: qty 1000

## 2017-02-21 MED ORDER — LIDOCAINE HCL (PF) 1 % IJ SOLN
INTRAMUSCULAR | Status: AC
  Filled 2017-02-21: qty 30

## 2017-02-21 MED ORDER — CEFAZOLIN SODIUM-DEXTROSE 2-4 GM/100ML-% IV SOLN
INTRAVENOUS | Status: AC
  Filled 2017-02-21: qty 100

## 2017-02-21 MED ORDER — DOCUSATE SODIUM 100 MG PO CAPS: 100.0000 mg | ORAL_CAPSULE | Freq: Every day | ORAL | Status: DC

## 2017-02-21 MED ORDER — IODIXANOL 320 MG/ML IV SOLN
INTRAVENOUS | Status: DC | PRN
  Administered 2017-02-21: 100 mL via INTRA_ARTERIAL

## 2017-02-21 MED ORDER — ONDANSETRON HCL 4 MG/2ML IJ SOLN: 4.0000 mg | Freq: Four times a day (QID) | INTRAMUSCULAR | Status: DC | PRN

## 2017-02-21 MED ORDER — HEPARIN SODIUM (PORCINE) 1000 UNIT/ML IJ SOLN
INTRAMUSCULAR | Status: AC
  Filled 2017-02-21: qty 1

## 2017-02-21 MED ORDER — SODIUM CHLORIDE 0.9% FLUSH: 3.0000 mL | INTRAVENOUS | Status: DC | PRN

## 2017-02-21 MED ORDER — VANCOMYCIN HCL IN DEXTROSE 1-5 GM/200ML-% IV SOLN
1000.0000 mg | Freq: Once | INTRAVENOUS | Status: AC
  Administered 2017-02-21: 1000 mg via INTRAVENOUS
  Filled 2017-02-21: qty 200

## 2017-02-21 MED ORDER — MIDAZOLAM HCL 2 MG/2ML IJ SOLN
INTRAMUSCULAR | Status: AC
  Filled 2017-02-21: qty 2

## 2017-02-21 MED ORDER — ACETAMINOPHEN 325 MG RE SUPP
325.0000 mg | RECTAL | Status: DC | PRN
  Filled 2017-02-21: qty 2

## 2017-02-21 MED ORDER — HYDRALAZINE HCL 20 MG/ML IJ SOLN: 5.0000 mg | INTRAMUSCULAR | Status: DC | PRN

## 2017-02-21 SURGICAL SUPPLY — 29 items
BALLN COYOTE ES OTW 4X20X143 (BALLOONS) ×3
BALLN STERLING OTW 8X60X135 (BALLOONS) ×3
BALLOON COYOTE ES OTW 4X20X143 (BALLOONS) ×2 IMPLANT
BALLOON STERLING OTW 8X60X135 (BALLOONS) ×2 IMPLANT
CATH ANGIO 5F PIGTAIL 65CM (CATHETERS) ×3 IMPLANT
CATH CXI 2.6F 65 ST (CATHETERS) ×1
CATH SOFT-VU 4F 65 STRAIGHT (CATHETERS) ×2 IMPLANT
CATH SOFT-VU STRAIGHT 4F 65CM (CATHETERS) ×1
CATH SPRT STRG 65X2.6FR BRD (CATHETERS) ×2 IMPLANT
COVER PRB 48X5XTLSCP FOLD TPE (BAG) ×2 IMPLANT
COVER PROBE 5X48 (BAG) ×1
DEVICE CLOSURE PERCLS PRGLD 6F (VASCULAR PRODUCTS) ×6 IMPLANT
KIT ENCORE 26 ADVANTAGE (KITS) ×6 IMPLANT
KIT PV (KITS) ×3 IMPLANT
PERCLOSE PROGLIDE 6F (VASCULAR PRODUCTS) ×9
SHEATH BRITE TIP 7FR 35CM (SHEATH) ×6 IMPLANT
SHEATH PINNACLE 5F 10CM (SHEATH) ×3 IMPLANT
SHEATH PINNACLE 7F 10CM (SHEATH) ×3 IMPLANT
STENT EXPRESS LD 9X25X75 (Permanent Stent) ×3 IMPLANT
STENT EXPRESS LD 9X37X75 (Permanent Stent) ×3 IMPLANT
SYRINGE MEDRAD AVANTA MACH 7 (SYRINGE) ×3 IMPLANT
TAPE VIPERTRACK RADIOPAQ (MISCELLANEOUS) ×2 IMPLANT
TAPE VIPERTRACK RADIOPAQUE (MISCELLANEOUS) ×1
TRANSDUCER W/STOPCOCK (MISCELLANEOUS) ×3 IMPLANT
TRAY PV CATH (CUSTOM PROCEDURE TRAY) ×3 IMPLANT
WIRE APPROACH CTO .014 18G 135 (WIRE) ×3 IMPLANT
WIRE HITORQ VERSACORE ST 145CM (WIRE) ×6 IMPLANT
WIRE MINI STICK MAX (SHEATH) ×3 IMPLANT
WIRE SPARTACORE .014X300CM (WIRE) ×3 IMPLANT

## 2017-02-21 NOTE — Discharge Instructions (Signed)
Femoral Site Care °Refer to this sheet in the next few weeks. These instructions provide you with information about caring for yourself after your procedure. Your health care provider may also give you more specific instructions. Your treatment has been planned according to current medical practices, but problems sometimes occur. Call your health care provider if you have any problems or questions after your procedure. °What can I expect after the procedure? °After your procedure, it is typical to have the following: °· Bruising at the site that usually fades within 1-2 weeks. °· Blood collecting in the tissue (hematoma) that may be painful to the touch. It should usually decrease in size and tenderness within 1-2 weeks. °Follow these instructions at home: °· Take medicines only as directed by your health care provider. °· You may shower 24-48 hours after the procedure or as directed by your health care provider. Remove the bandage (dressing) and gently wash the site with plain soap and water. Pat the area dry with a clean towel. Do not rub the site, because this may cause bleeding. °· Do not take baths, swim, or use a hot tub until your health care provider approves. °· Check your insertion site every day for redness, swelling, or drainage. °· Do not apply powder or lotion to the site. °· Limit use of stairs to twice a day for the first 2-3 days or as directed by your health care provider. °· Do not squat for the first 2-3 days or as directed by your health care provider. °· Do not lift over 10 lb (4.5 kg) for 5 days after your procedure or as directed by your health care provider. °· Ask your health care provider when it is okay to: °¨ Return to work or school. °¨ Resume usual physical activities or sports. °¨ Resume sexual activity. °· Do not drive home if you are discharged the same day as the procedure. Have someone else drive you. °· You may drive 24 hours after the procedure unless otherwise instructed by  your health care provider. °· Do not operate machinery or power tools for 24 hours after the procedure or as directed by your health care provider. °· If your procedure was done as an outpatient procedure, which means that you went home the same day as your procedure, a responsible adult should be with you for the first 24 hours after you arrive home. °· Keep all follow-up visits as directed by your health care provider. This is important. °Contact a health care provider if: °· You have a fever. °· You have chills. °· You have increased bleeding from the site. Hold pressure on the site. °Get help right away if: °· You have unusual pain at the site. °· You have redness, warmth, or swelling at the site. °· You have drainage (other than a small amount of blood on the dressing) from the site. °· The site is bleeding, and the bleeding does not stop after 30 minutes of holding steady pressure on the site. °· Your leg or foot becomes pale, cool, tingly, or numb. °This information is not intended to replace advice given to you by your health care provider. Make sure you discuss any questions you have with your health care provider. °Document Released: 08/15/2014 Document Revised: 05/19/2016 Document Reviewed: 07/01/2014 °Elsevier Interactive Patient Education © 2017 [employer] ° °

## 2017-02-21 NOTE — Progress Notes (Signed)
On arrival c/o shortness of breath and Dr Jacinto HalimGanji in and notified and orders noted and O2 started at 2l/min via nasal cannula

## 2017-02-21 NOTE — Interval H&P Note (Signed)
History and Physical Interval Note:  02/21/2017 7:40 AM  Christine BenedictJudy Levy  has presented today for surgery, with the diagnosis of PAD  The various methods of treatment have been discussed with the patient and family. After consideration of risks, benefits and other options for treatment, the patient has consented to  Procedure(s): Lower Extremity Angiography (N/A) and possible PTA as a surgical intervention .  The patient's history has been reviewed, patient examined, no change in status, stable for surgery.  I have reviewed the patient's chart and labs.  Questions were answered to the patient's satisfaction.     Yates DecampGANJI, Asahel Risden

## 2017-02-22 ENCOUNTER — Encounter (HOSPITAL_COMMUNITY): Payer: Self-pay | Admitting: Cardiology

## 2017-02-28 DIAGNOSIS — Z9862 Peripheral vascular angioplasty status: Secondary | ICD-10-CM | POA: Diagnosis not present

## 2017-02-28 DIAGNOSIS — I739 Peripheral vascular disease, unspecified: Secondary | ICD-10-CM | POA: Diagnosis not present

## 2017-03-03 DIAGNOSIS — I25119 Atherosclerotic heart disease of native coronary artery with unspecified angina pectoris: Secondary | ICD-10-CM | POA: Diagnosis not present

## 2017-03-03 DIAGNOSIS — E118 Type 2 diabetes mellitus with unspecified complications: Secondary | ICD-10-CM | POA: Diagnosis not present

## 2017-03-03 DIAGNOSIS — E1165 Type 2 diabetes mellitus with hyperglycemia: Secondary | ICD-10-CM | POA: Diagnosis not present

## 2017-03-03 DIAGNOSIS — I255 Ischemic cardiomyopathy: Secondary | ICD-10-CM | POA: Diagnosis not present

## 2017-03-10 DIAGNOSIS — I10 Essential (primary) hypertension: Secondary | ICD-10-CM | POA: Diagnosis not present

## 2017-03-13 DIAGNOSIS — I25119 Atherosclerotic heart disease of native coronary artery with unspecified angina pectoris: Secondary | ICD-10-CM | POA: Diagnosis not present

## 2017-03-13 DIAGNOSIS — I255 Ischemic cardiomyopathy: Secondary | ICD-10-CM | POA: Diagnosis not present

## 2017-03-13 DIAGNOSIS — E118 Type 2 diabetes mellitus with unspecified complications: Secondary | ICD-10-CM | POA: Diagnosis not present

## 2017-03-13 DIAGNOSIS — E1165 Type 2 diabetes mellitus with hyperglycemia: Secondary | ICD-10-CM | POA: Diagnosis not present

## 2017-03-15 ENCOUNTER — Telehealth (HOSPITAL_COMMUNITY): Payer: Self-pay | Admitting: Internal Medicine

## 2017-03-15 NOTE — Telephone Encounter (Signed)
I called and spoke with patient about participating and scheduling for the Cardiac Rehab program. Patient was offered next available appointment in April. Patient stated that would not work for her due to her being out of town. I asked when she would be returning and she stated in October. Patient referral on hold until further notice.

## 2017-03-21 DIAGNOSIS — H52223 Regular astigmatism, bilateral: Secondary | ICD-10-CM | POA: Diagnosis not present

## 2017-03-21 DIAGNOSIS — E113293 Type 2 diabetes mellitus with mild nonproliferative diabetic retinopathy without macular edema, bilateral: Secondary | ICD-10-CM | POA: Diagnosis not present

## 2017-03-21 DIAGNOSIS — H5213 Myopia, bilateral: Secondary | ICD-10-CM | POA: Diagnosis not present

## 2017-03-21 DIAGNOSIS — H25812 Combined forms of age-related cataract, left eye: Secondary | ICD-10-CM | POA: Diagnosis not present

## 2017-03-21 DIAGNOSIS — H353121 Nonexudative age-related macular degeneration, left eye, early dry stage: Secondary | ICD-10-CM | POA: Diagnosis not present

## 2017-03-21 DIAGNOSIS — H524 Presbyopia: Secondary | ICD-10-CM | POA: Diagnosis not present

## 2017-03-21 DIAGNOSIS — H2513 Age-related nuclear cataract, bilateral: Secondary | ICD-10-CM | POA: Diagnosis not present

## 2017-04-06 DIAGNOSIS — E118 Type 2 diabetes mellitus with unspecified complications: Secondary | ICD-10-CM | POA: Diagnosis not present

## 2017-04-06 DIAGNOSIS — I10 Essential (primary) hypertension: Secondary | ICD-10-CM | POA: Diagnosis not present

## 2017-04-12 DIAGNOSIS — I10 Essential (primary) hypertension: Secondary | ICD-10-CM | POA: Diagnosis not present

## 2017-04-12 DIAGNOSIS — E118 Type 2 diabetes mellitus with unspecified complications: Secondary | ICD-10-CM | POA: Diagnosis not present

## 2017-04-12 DIAGNOSIS — I739 Peripheral vascular disease, unspecified: Secondary | ICD-10-CM | POA: Diagnosis not present

## 2017-05-02 DIAGNOSIS — I2510 Atherosclerotic heart disease of native coronary artery without angina pectoris: Secondary | ICD-10-CM | POA: Diagnosis not present

## 2017-05-02 DIAGNOSIS — E782 Mixed hyperlipidemia: Secondary | ICD-10-CM | POA: Diagnosis not present

## 2017-05-02 DIAGNOSIS — I10 Essential (primary) hypertension: Secondary | ICD-10-CM | POA: Diagnosis not present

## 2017-05-02 DIAGNOSIS — I5021 Acute systolic (congestive) heart failure: Secondary | ICD-10-CM | POA: Diagnosis not present

## 2017-05-05 DIAGNOSIS — I5021 Acute systolic (congestive) heart failure: Secondary | ICD-10-CM | POA: Diagnosis not present

## 2017-05-05 DIAGNOSIS — I10 Essential (primary) hypertension: Secondary | ICD-10-CM | POA: Diagnosis not present

## 2017-05-05 DIAGNOSIS — E782 Mixed hyperlipidemia: Secondary | ICD-10-CM | POA: Diagnosis not present

## 2017-05-05 DIAGNOSIS — I2510 Atherosclerotic heart disease of native coronary artery without angina pectoris: Secondary | ICD-10-CM | POA: Diagnosis not present

## 2017-05-09 DIAGNOSIS — E119 Type 2 diabetes mellitus without complications: Secondary | ICD-10-CM | POA: Diagnosis not present

## 2017-05-09 DIAGNOSIS — H25043 Posterior subcapsular polar age-related cataract, bilateral: Secondary | ICD-10-CM | POA: Diagnosis not present

## 2017-05-09 DIAGNOSIS — H5213 Myopia, bilateral: Secondary | ICD-10-CM | POA: Diagnosis not present

## 2017-05-09 DIAGNOSIS — R911 Solitary pulmonary nodule: Secondary | ICD-10-CM | POA: Diagnosis not present

## 2017-05-09 DIAGNOSIS — H2513 Age-related nuclear cataract, bilateral: Secondary | ICD-10-CM | POA: Diagnosis not present

## 2017-05-12 DIAGNOSIS — H1131 Conjunctival hemorrhage, right eye: Secondary | ICD-10-CM | POA: Diagnosis not present

## 2017-05-12 DIAGNOSIS — H2513 Age-related nuclear cataract, bilateral: Secondary | ICD-10-CM | POA: Diagnosis not present

## 2017-05-12 DIAGNOSIS — E119 Type 2 diabetes mellitus without complications: Secondary | ICD-10-CM | POA: Diagnosis not present

## 2017-06-10 DIAGNOSIS — E782 Mixed hyperlipidemia: Secondary | ICD-10-CM | POA: Diagnosis not present

## 2017-06-10 DIAGNOSIS — E119 Type 2 diabetes mellitus without complications: Secondary | ICD-10-CM | POA: Diagnosis not present

## 2017-06-10 DIAGNOSIS — Z955 Presence of coronary angioplasty implant and graft: Secondary | ICD-10-CM | POA: Diagnosis not present

## 2017-06-10 DIAGNOSIS — I509 Heart failure, unspecified: Secondary | ICD-10-CM | POA: Diagnosis not present

## 2017-06-10 DIAGNOSIS — Z87891 Personal history of nicotine dependence: Secondary | ICD-10-CM | POA: Diagnosis not present

## 2017-06-10 DIAGNOSIS — Z880 Allergy status to penicillin: Secondary | ICD-10-CM | POA: Diagnosis not present

## 2017-06-10 DIAGNOSIS — J449 Chronic obstructive pulmonary disease, unspecified: Secondary | ICD-10-CM | POA: Diagnosis not present

## 2017-06-10 DIAGNOSIS — I447 Left bundle-branch block, unspecified: Secondary | ICD-10-CM | POA: Diagnosis not present

## 2017-06-10 DIAGNOSIS — G4733 Obstructive sleep apnea (adult) (pediatric): Secondary | ICD-10-CM | POA: Diagnosis not present

## 2017-06-10 DIAGNOSIS — R0602 Shortness of breath: Secondary | ICD-10-CM | POA: Diagnosis not present

## 2017-06-10 DIAGNOSIS — I252 Old myocardial infarction: Secondary | ICD-10-CM | POA: Diagnosis not present

## 2017-06-10 DIAGNOSIS — Z7982 Long term (current) use of aspirin: Secondary | ICD-10-CM | POA: Diagnosis not present

## 2017-06-10 DIAGNOSIS — K029 Dental caries, unspecified: Secondary | ICD-10-CM | POA: Diagnosis not present

## 2017-06-10 DIAGNOSIS — I2510 Atherosclerotic heart disease of native coronary artery without angina pectoris: Secondary | ICD-10-CM | POA: Diagnosis not present

## 2017-06-10 DIAGNOSIS — I10 Essential (primary) hypertension: Secondary | ICD-10-CM | POA: Diagnosis not present

## 2017-06-10 DIAGNOSIS — Z794 Long term (current) use of insulin: Secondary | ICD-10-CM | POA: Diagnosis not present

## 2017-06-10 DIAGNOSIS — Z882 Allergy status to sulfonamides status: Secondary | ICD-10-CM | POA: Diagnosis not present

## 2017-06-10 DIAGNOSIS — K0889 Other specified disorders of teeth and supporting structures: Secondary | ICD-10-CM | POA: Diagnosis not present

## 2017-06-10 DIAGNOSIS — R110 Nausea: Secondary | ICD-10-CM | POA: Diagnosis not present

## 2017-06-10 DIAGNOSIS — I110 Hypertensive heart disease with heart failure: Secondary | ICD-10-CM | POA: Diagnosis not present

## 2017-06-11 DIAGNOSIS — I447 Left bundle-branch block, unspecified: Secondary | ICD-10-CM | POA: Diagnosis not present

## 2017-07-18 DIAGNOSIS — E119 Type 2 diabetes mellitus without complications: Secondary | ICD-10-CM | POA: Diagnosis not present

## 2017-07-18 DIAGNOSIS — Y939 Activity, unspecified: Secondary | ICD-10-CM | POA: Diagnosis not present

## 2017-07-18 DIAGNOSIS — M25561 Pain in right knee: Secondary | ICD-10-CM | POA: Diagnosis not present

## 2017-07-18 DIAGNOSIS — S8391XA Sprain of unspecified site of right knee, initial encounter: Secondary | ICD-10-CM | POA: Diagnosis not present

## 2017-07-18 DIAGNOSIS — Z794 Long term (current) use of insulin: Secondary | ICD-10-CM | POA: Diagnosis not present

## 2017-07-18 DIAGNOSIS — I110 Hypertensive heart disease with heart failure: Secondary | ICD-10-CM | POA: Diagnosis not present

## 2017-07-18 DIAGNOSIS — G4730 Sleep apnea, unspecified: Secondary | ICD-10-CM | POA: Diagnosis not present

## 2017-07-18 DIAGNOSIS — W19XXXA Unspecified fall, initial encounter: Secondary | ICD-10-CM | POA: Diagnosis not present

## 2017-07-18 DIAGNOSIS — I509 Heart failure, unspecified: Secondary | ICD-10-CM | POA: Diagnosis not present

## 2017-07-18 DIAGNOSIS — Z955 Presence of coronary angioplasty implant and graft: Secondary | ICD-10-CM | POA: Diagnosis not present

## 2017-07-18 DIAGNOSIS — Z8249 Family history of ischemic heart disease and other diseases of the circulatory system: Secondary | ICD-10-CM | POA: Diagnosis not present

## 2017-07-18 DIAGNOSIS — Z87891 Personal history of nicotine dependence: Secondary | ICD-10-CM | POA: Diagnosis not present

## 2017-07-18 DIAGNOSIS — Z79899 Other long term (current) drug therapy: Secondary | ICD-10-CM | POA: Diagnosis not present

## 2017-07-18 DIAGNOSIS — M1711 Unilateral primary osteoarthritis, right knee: Secondary | ICD-10-CM | POA: Diagnosis not present

## 2017-07-18 DIAGNOSIS — E785 Hyperlipidemia, unspecified: Secondary | ICD-10-CM | POA: Diagnosis not present

## 2017-07-18 DIAGNOSIS — I252 Old myocardial infarction: Secondary | ICD-10-CM | POA: Diagnosis not present

## 2017-07-18 DIAGNOSIS — Y92012 Bathroom of single-family (private) house as the place of occurrence of the external cause: Secondary | ICD-10-CM | POA: Diagnosis not present

## 2017-07-19 DIAGNOSIS — M25561 Pain in right knee: Secondary | ICD-10-CM | POA: Diagnosis not present

## 2017-07-28 DIAGNOSIS — I5021 Acute systolic (congestive) heart failure: Secondary | ICD-10-CM | POA: Diagnosis not present

## 2017-07-28 DIAGNOSIS — I2510 Atherosclerotic heart disease of native coronary artery without angina pectoris: Secondary | ICD-10-CM | POA: Diagnosis not present

## 2017-07-28 DIAGNOSIS — E1151 Type 2 diabetes mellitus with diabetic peripheral angiopathy without gangrene: Secondary | ICD-10-CM | POA: Diagnosis not present

## 2017-07-28 DIAGNOSIS — E119 Type 2 diabetes mellitus without complications: Secondary | ICD-10-CM | POA: Diagnosis not present

## 2017-07-28 DIAGNOSIS — S83241D Other tear of medial meniscus, current injury, right knee, subsequent encounter: Secondary | ICD-10-CM | POA: Diagnosis not present

## 2017-07-28 DIAGNOSIS — I509 Heart failure, unspecified: Secondary | ICD-10-CM | POA: Diagnosis not present

## 2017-07-28 DIAGNOSIS — I739 Peripheral vascular disease, unspecified: Secondary | ICD-10-CM | POA: Diagnosis not present

## 2017-07-28 DIAGNOSIS — I2101 ST elevation (STEMI) myocardial infarction involving left main coronary artery: Secondary | ICD-10-CM | POA: Diagnosis not present

## 2017-08-08 DIAGNOSIS — H2511 Age-related nuclear cataract, right eye: Secondary | ICD-10-CM | POA: Diagnosis not present

## 2017-08-15 DIAGNOSIS — H5371 Glare sensitivity: Secondary | ICD-10-CM | POA: Diagnosis not present

## 2017-08-15 DIAGNOSIS — E1136 Type 2 diabetes mellitus with diabetic cataract: Secondary | ICD-10-CM | POA: Diagnosis not present

## 2017-08-15 DIAGNOSIS — Z880 Allergy status to penicillin: Secondary | ICD-10-CM | POA: Diagnosis not present

## 2017-08-15 DIAGNOSIS — Z886 Allergy status to analgesic agent status: Secondary | ICD-10-CM | POA: Diagnosis not present

## 2017-08-15 DIAGNOSIS — H269 Unspecified cataract: Secondary | ICD-10-CM | POA: Diagnosis not present

## 2017-08-15 DIAGNOSIS — H2511 Age-related nuclear cataract, right eye: Secondary | ICD-10-CM | POA: Diagnosis not present

## 2017-08-15 DIAGNOSIS — G2581 Restless legs syndrome: Secondary | ICD-10-CM | POA: Diagnosis not present

## 2017-08-15 DIAGNOSIS — I509 Heart failure, unspecified: Secondary | ICD-10-CM | POA: Diagnosis not present

## 2017-08-15 DIAGNOSIS — F419 Anxiety disorder, unspecified: Secondary | ICD-10-CM | POA: Diagnosis not present

## 2017-08-15 DIAGNOSIS — I110 Hypertensive heart disease with heart failure: Secondary | ICD-10-CM | POA: Diagnosis not present

## 2017-08-15 DIAGNOSIS — E785 Hyperlipidemia, unspecified: Secondary | ICD-10-CM | POA: Diagnosis not present

## 2017-08-15 DIAGNOSIS — G4730 Sleep apnea, unspecified: Secondary | ICD-10-CM | POA: Diagnosis not present

## 2017-08-15 DIAGNOSIS — Z882 Allergy status to sulfonamides status: Secondary | ICD-10-CM | POA: Diagnosis not present

## 2017-08-15 DIAGNOSIS — I252 Old myocardial infarction: Secondary | ICD-10-CM | POA: Diagnosis not present

## 2017-08-15 DIAGNOSIS — Z794 Long term (current) use of insulin: Secondary | ICD-10-CM | POA: Diagnosis not present

## 2017-08-22 DIAGNOSIS — H2512 Age-related nuclear cataract, left eye: Secondary | ICD-10-CM | POA: Diagnosis not present

## 2017-08-30 DIAGNOSIS — I10 Essential (primary) hypertension: Secondary | ICD-10-CM | POA: Diagnosis not present

## 2017-08-30 DIAGNOSIS — E782 Mixed hyperlipidemia: Secondary | ICD-10-CM | POA: Diagnosis not present

## 2017-08-30 DIAGNOSIS — I255 Ischemic cardiomyopathy: Secondary | ICD-10-CM | POA: Diagnosis not present

## 2017-08-30 DIAGNOSIS — E1151 Type 2 diabetes mellitus with diabetic peripheral angiopathy without gangrene: Secondary | ICD-10-CM | POA: Diagnosis not present

## 2017-08-30 DIAGNOSIS — Z23 Encounter for immunization: Secondary | ICD-10-CM | POA: Diagnosis not present

## 2017-08-30 DIAGNOSIS — I739 Peripheral vascular disease, unspecified: Secondary | ICD-10-CM | POA: Diagnosis not present

## 2017-08-30 DIAGNOSIS — I5021 Acute systolic (congestive) heart failure: Secondary | ICD-10-CM | POA: Diagnosis not present

## 2017-08-30 DIAGNOSIS — Z1231 Encounter for screening mammogram for malignant neoplasm of breast: Secondary | ICD-10-CM | POA: Diagnosis not present

## 2017-09-05 DIAGNOSIS — H2512 Age-related nuclear cataract, left eye: Secondary | ICD-10-CM | POA: Diagnosis not present

## 2017-09-05 DIAGNOSIS — E785 Hyperlipidemia, unspecified: Secondary | ICD-10-CM | POA: Diagnosis not present

## 2017-09-05 DIAGNOSIS — I509 Heart failure, unspecified: Secondary | ICD-10-CM | POA: Diagnosis not present

## 2017-09-05 DIAGNOSIS — Z886 Allergy status to analgesic agent status: Secondary | ICD-10-CM | POA: Diagnosis not present

## 2017-09-05 DIAGNOSIS — I252 Old myocardial infarction: Secondary | ICD-10-CM | POA: Diagnosis not present

## 2017-09-05 DIAGNOSIS — Z880 Allergy status to penicillin: Secondary | ICD-10-CM | POA: Diagnosis not present

## 2017-09-05 DIAGNOSIS — I110 Hypertensive heart disease with heart failure: Secondary | ICD-10-CM | POA: Diagnosis not present

## 2017-09-05 DIAGNOSIS — Z882 Allergy status to sulfonamides status: Secondary | ICD-10-CM | POA: Diagnosis not present

## 2017-09-05 DIAGNOSIS — G2581 Restless legs syndrome: Secondary | ICD-10-CM | POA: Diagnosis not present

## 2017-09-05 DIAGNOSIS — E1136 Type 2 diabetes mellitus with diabetic cataract: Secondary | ICD-10-CM | POA: Diagnosis not present

## 2017-09-05 DIAGNOSIS — G4730 Sleep apnea, unspecified: Secondary | ICD-10-CM | POA: Diagnosis not present

## 2017-09-05 DIAGNOSIS — F419 Anxiety disorder, unspecified: Secondary | ICD-10-CM | POA: Diagnosis not present

## 2017-10-04 DIAGNOSIS — B351 Tinea unguium: Secondary | ICD-10-CM | POA: Diagnosis not present

## 2017-10-13 ENCOUNTER — Encounter: Payer: Self-pay | Admitting: Podiatry

## 2017-10-13 ENCOUNTER — Ambulatory Visit (INDEPENDENT_AMBULATORY_CARE_PROVIDER_SITE_OTHER): Payer: Medicare Other | Admitting: Podiatry

## 2017-10-13 VITALS — BP 128/62 | HR 74 | Resp 16

## 2017-10-13 DIAGNOSIS — S91212A Laceration without foreign body of left great toe with damage to nail, initial encounter: Secondary | ICD-10-CM | POA: Diagnosis not present

## 2017-10-13 DIAGNOSIS — S90425A Blister (nonthermal), left lesser toe(s), initial encounter: Secondary | ICD-10-CM | POA: Diagnosis not present

## 2017-10-13 NOTE — Progress Notes (Signed)
Subjective:  Patient ID: Christine BenedictJudy Levy, female    DOB: 01-Jul-1946,  MRN: [id number]  Chief Complaint  Patient presents with  . Toe Pain    1st and 2nd toes left - noticed skin was tore on hallux last night, cleaned, PCP today referred here for treatment, I mentioned to pt about the blistered area on 2nd toe but she wasn't aware of it, "no injury"    71 y.o. female presents with the above complaint. Denies known injury. Unsure how the issue occurred. Endorses history of diabetes mellitus control with insulin.  Marland Kitchen.No past medical history on file. Past Surgical History:  Procedure Laterality Date  . ABDOMINAL AORTOGRAM N/A 02/21/2017   Procedure: Abdominal Aortogram;  Surgeon: Yates DecampJay Ganji, MD;  [facility];  Service: Cardiovascular;  Laterality: N/A;  . LOWER EXTREMITY ANGIOGRAPHY N/A 02/21/2017   Procedure: Lower Extremity Angiography;  Surgeon: Yates DecampJay Ganji, MD;  [facility];  Service: Cardiovascular;  Laterality: N/A;  . PERIPHERAL VASCULAR INTERVENTION  02/21/2017   Procedure: Peripheral Vascular Intervention;  Surgeon: Yates DecampJay Ganji, MD;  [facility];  Service: Cardiovascular;;    Current Outpatient Prescriptions:  .  aspirin EC 81 MG tablet, Take 81 mg by mouth daily., Disp: , Rfl:  .  atorvastatin (LIPITOR) 80 MG tablet, Take 80 mg by mouth daily at 6 PM., Disp: , Rfl:  .  carvedilol (COREG) 3.125 MG tablet, Take 9.375-12.5 mg by mouth See admin instructions. Taking 3 tablets twice daily until 02-16-17 then will be taking 4 tablets (12.5 mg) twice daily, Disp: , Rfl:  .  furosemide (LASIX) 40 MG tablet, Take 40-80 mg by mouth daily as needed for fluid or edema (1-2 tablets depends how much fluid). , Disp: , Rfl:  .  insulin glargine (LANTUS) 100 UNIT/ML injection, Inject 30 Units into the skin 2 (two) times daily., Disp: , Rfl:  .  nitroGLYCERIN (NITROSTAT) 0.4 MG SL tablet, Place 0.4 mg under the tongue every 5 (five) minutes as needed for chest  pain., Disp: , Rfl:  .  rOPINIRole (REQUIP) 2 MG tablet, Take 2 mg by mouth at bedtime., Disp: , Rfl:  .  sacubitril-valsartan (ENTRESTO) 24-26 MG, Take 1 tablet by mouth 2 (two) times daily., Disp: , Rfl:  .  sertraline (ZOLOFT) 100 MG tablet, Take 100 mg by mouth daily., Disp: , Rfl:  .  solifenacin (VESICARE) 5 MG tablet, Take 5 mg by mouth every evening., Disp: , Rfl:  .  ticagrelor (BRILINTA) 90 MG TABS tablet, Take 90 mg by mouth every evening., Disp: , Rfl:   Allergies  Allergen Reactions  . Penicillins Other (See Comments)    Has patient had a PCN reaction causing immediate rash, facial/tongue/throat swelling, SOB or lightheadedness with hypotension: unknown  Has patient had a PCN reaction causing severe rash involving mucus membranes or skin necrosis: unknown Has patient had a PCN reaction that required hospitalization {unknown  Has patient had a PCN reaction occurring within the last 10 years: No If all of the above answers are "NO", then may proceed with Cephalosporin use.   Marland Kitchen. Percodan [Oxycodone-Aspirin] Other (See Comments)    Altered mental status  . Sulfa Antibiotics Other (See Comments)    unknown  . Epinephrine Other (See Comments) and Palpitations    Tachycardia Heart palpations   Review of Systems  All other systems reviewed and are negative.  Objective:   Vitals:   10/13/17 1514  BP: 128/62  Pulse: 74  Resp: 16   General AA&O x3. Normal mood and affect.  Vascular Dorsalis pedis and posterior tibial pulses  present 1+ bilaterally  Capillary refill normal to all digits. Pedal hair growth normal.  Neurologic Epicritic sensation grossly Diminished.  Dermatologic Right hallux with large bulla. Bulla incised and redundant skin deroofed. Wound base clean and granular. Bulla left second toe left intact. Interspaces clear of maceration. Nails well groomed and normal in appearance.   Orthopedic: MMT 5/5 in dorsiflexion, plantarflexion, inversion, and  eversion. Normal joint ROM without pain or crepitus.   Assessment & Plan:  Patient was evaluated and treated and all questions answered.  Bulla left great toe, left second toe -Bulla incised in deroofed. Povidone ointment applied. Sterile dressing applied. -Educated on care including application of povidone ointment -Follow-up in one week for wound check

## 2017-10-20 ENCOUNTER — Ambulatory Visit (INDEPENDENT_AMBULATORY_CARE_PROVIDER_SITE_OTHER): Payer: Medicare Other | Admitting: Podiatry

## 2017-10-20 ENCOUNTER — Encounter: Payer: Self-pay | Admitting: Podiatry

## 2017-10-20 DIAGNOSIS — E1142 Type 2 diabetes mellitus with diabetic polyneuropathy: Secondary | ICD-10-CM | POA: Diagnosis not present

## 2017-10-20 DIAGNOSIS — L97521 Non-pressure chronic ulcer of other part of left foot limited to breakdown of skin: Secondary | ICD-10-CM | POA: Diagnosis not present

## 2017-10-20 DIAGNOSIS — B351 Tinea unguium: Secondary | ICD-10-CM | POA: Diagnosis not present

## 2017-10-20 NOTE — Progress Notes (Signed)
  Subjective:  Patient ID: Christine BenedictJudy Levy, female    DOB: [birth year],  MRN: [id number]  Chief Complaint  Patient presents with  . Foot Ulcer    Follow up 1st and 2nd toes left   "They are doing okay"   71 y.o. female returns for the above complaint.  States that the area is doing okay.  Has been applying a Band-Aid and antibiotic ointment daily.  Denies other issues.  Requesting care of her toenails today.  Objective:  There were no vitals filed for this visit. General AA&O x3. Normal mood and affect.  Vascular Foot warm to touch.  Neurologic Epicritic sensation grossly absent.  Dermatologic L great toe ulceration with granular base flush to skin. Periwound soft without hyperkeratosis. No active drainage.  No erythema.  No signs of infection L 2nd toe with soft callus distally.  Nails elongated and dystrophic with yellow discoloration.  Right hallux nail left fifth toenail with excessive thickness.  Orthopedic: No pain to palpation either foot.   Assessment & Plan:  Patient was evaluated and treated and all questions answered.  Ulceration left great toe -Wound healing well.  Wound base cauterized to promote epithelialization. -Dry dressing applied -Patient to apply Band-Aid daily  Procedure: Chemical Cauterization of Granulation Tissue Rationale: Cauterize granular wound base to promote healing.  Instrumentation: Silver nitrate stick x4 Dressing: Dry, sterile, compression dressing. Disposition: Patient tolerated procedure well. Patient to return in 1 week for follow-up.  Return in about 1 week (around 10/27/2017) for wounc check.

## 2017-10-26 ENCOUNTER — Ambulatory Visit (INDEPENDENT_AMBULATORY_CARE_PROVIDER_SITE_OTHER): Payer: Medicare Other | Admitting: Podiatry

## 2017-10-26 DIAGNOSIS — L97521 Non-pressure chronic ulcer of other part of left foot limited to breakdown of skin: Secondary | ICD-10-CM

## 2017-10-26 DIAGNOSIS — E1142 Type 2 diabetes mellitus with diabetic polyneuropathy: Secondary | ICD-10-CM

## 2017-10-30 ENCOUNTER — Encounter: Payer: Self-pay | Admitting: Podiatry

## 2017-10-30 DIAGNOSIS — E1142 Type 2 diabetes mellitus with diabetic polyneuropathy: Secondary | ICD-10-CM | POA: Insufficient documentation

## 2017-10-30 NOTE — Progress Notes (Signed)
  Subjective:  Patient ID: Christine BenedictJudy Levy, female    DOB: May 18, 1946,  MRN: [id number]  Chief Complaint  Patient presents with  . Diabetic Ulcer    Left great and 2nd toe 1 week follow up   71 y.o. female returns for the above complaint.  Believes that the left great and second toe ulcers are improving.  Has been applying antibiotic ointment daily  Objective:  There were no vitals filed for this visit. General AA&O x3. Normal mood and affect.  Vascular Pedal pulses palpable.  Neurologic Epicritic sensation grossly intact.  Dermatologic  L great toe with fibrogranular wound base, some hyperkeratosis. Improved. Measuring approx 1 cm x 0.5 cm. No erythema. No drainage. No signs of acute infection. L 2nd toe epithelialized/overlying callus  Orthopedic: No pain to palpation either foot.   Assessment & Plan:  Patient was evaluated and treated and all questions answered.  Ulcer L Great Toe -Selective debridement of ulcer as below. -Dressed with abx ointment and DSD.  Procedure: Selective Debridement of Wound Rationale: Removal of devitalized tissue from the wound to promote healing.  Pre-Debridement Wound Measurements: 1.0 cm x 0.5 cm x 0.1 cm  Post-Debridement Wound Measurements: same as pre-debridement. Type of Debridement: Selective Tissue Removed: Devitalized soft-tissue, fibrotic tissue from wound base. Instrumentation: 3-0 mm dermal curette Dressing: Dry, sterile, compression dressing. Disposition: Patient tolerated procedure well. Patient to return in 2 week for follow-up. 2 Return in about 2 weeks (around 11/09/2017).

## 2017-11-03 DIAGNOSIS — L97301 Non-pressure chronic ulcer of unspecified ankle limited to breakdown of skin: Secondary | ICD-10-CM | POA: Diagnosis not present

## 2017-11-03 DIAGNOSIS — E11622 Type 2 diabetes mellitus with other skin ulcer: Secondary | ICD-10-CM | POA: Diagnosis not present

## 2017-11-03 DIAGNOSIS — Z0000 Encounter for general adult medical examination without abnormal findings: Secondary | ICD-10-CM | POA: Diagnosis not present

## 2017-11-09 ENCOUNTER — Encounter: Payer: Self-pay | Admitting: Podiatry

## 2017-11-09 ENCOUNTER — Ambulatory Visit (INDEPENDENT_AMBULATORY_CARE_PROVIDER_SITE_OTHER): Payer: Medicare Other | Admitting: Podiatry

## 2017-11-09 DIAGNOSIS — L97521 Non-pressure chronic ulcer of other part of left foot limited to breakdown of skin: Secondary | ICD-10-CM

## 2017-11-30 ENCOUNTER — Ambulatory Visit (INDEPENDENT_AMBULATORY_CARE_PROVIDER_SITE_OTHER): Payer: Medicare Other | Admitting: Podiatry

## 2017-11-30 DIAGNOSIS — L97521 Non-pressure chronic ulcer of other part of left foot limited to breakdown of skin: Secondary | ICD-10-CM

## 2017-12-08 DIAGNOSIS — E11622 Type 2 diabetes mellitus with other skin ulcer: Secondary | ICD-10-CM | POA: Diagnosis not present

## 2017-12-08 DIAGNOSIS — Z794 Long term (current) use of insulin: Secondary | ICD-10-CM | POA: Diagnosis not present

## 2017-12-08 DIAGNOSIS — E118 Type 2 diabetes mellitus with unspecified complications: Secondary | ICD-10-CM | POA: Diagnosis not present

## 2017-12-08 DIAGNOSIS — Z1231 Encounter for screening mammogram for malignant neoplasm of breast: Secondary | ICD-10-CM | POA: Diagnosis not present

## 2017-12-08 DIAGNOSIS — Z0000 Encounter for general adult medical examination without abnormal findings: Secondary | ICD-10-CM | POA: Diagnosis not present

## 2017-12-08 DIAGNOSIS — E7800 Pure hypercholesterolemia, unspecified: Secondary | ICD-10-CM | POA: Diagnosis not present

## 2017-12-08 DIAGNOSIS — I10 Essential (primary) hypertension: Secondary | ICD-10-CM | POA: Diagnosis not present

## 2017-12-28 ENCOUNTER — Ambulatory Visit: Payer: Medicare Other | Admitting: Podiatry

## 2018-01-02 DIAGNOSIS — R0602 Shortness of breath: Secondary | ICD-10-CM | POA: Diagnosis not present

## 2018-01-03 ENCOUNTER — Emergency Department (HOSPITAL_COMMUNITY): Payer: Medicare Other

## 2018-01-03 ENCOUNTER — Encounter (HOSPITAL_COMMUNITY): Payer: Self-pay

## 2018-01-03 ENCOUNTER — Inpatient Hospital Stay (HOSPITAL_COMMUNITY)
Admission: EM | Admit: 2018-01-03 | Discharge: 2018-01-05 | DRG: 291 | Disposition: A | Discharge status: Home / Self Care | Payer: Medicare Other | Attending: Nephrology | Admitting: Nephrology

## 2018-01-03 DIAGNOSIS — J90 Pleural effusion, not elsewhere classified: Secondary | ICD-10-CM | POA: Diagnosis present

## 2018-01-03 DIAGNOSIS — I255 Ischemic cardiomyopathy: Secondary | ICD-10-CM | POA: Diagnosis present

## 2018-01-03 DIAGNOSIS — E876 Hypokalemia: Secondary | ICD-10-CM | POA: Diagnosis present

## 2018-01-03 DIAGNOSIS — Z23 Encounter for immunization: Secondary | ICD-10-CM | POA: Diagnosis not present

## 2018-01-03 DIAGNOSIS — Z87891 Personal history of nicotine dependence: Secondary | ICD-10-CM

## 2018-01-03 DIAGNOSIS — E1151 Type 2 diabetes mellitus with diabetic peripheral angiopathy without gangrene: Secondary | ICD-10-CM | POA: Diagnosis present

## 2018-01-03 DIAGNOSIS — I5043 Acute on chronic combined systolic (congestive) and diastolic (congestive) heart failure: Secondary | ICD-10-CM | POA: Diagnosis present

## 2018-01-03 DIAGNOSIS — E782 Mixed hyperlipidemia: Secondary | ICD-10-CM | POA: Diagnosis present

## 2018-01-03 DIAGNOSIS — I252 Old myocardial infarction: Secondary | ICD-10-CM | POA: Diagnosis not present

## 2018-01-03 DIAGNOSIS — F329 Major depressive disorder, single episode, unspecified: Secondary | ICD-10-CM | POA: Diagnosis present

## 2018-01-03 DIAGNOSIS — R748 Abnormal levels of other serum enzymes: Secondary | ICD-10-CM | POA: Diagnosis present

## 2018-01-03 DIAGNOSIS — I130 Hypertensive heart and chronic kidney disease with heart failure and stage 1 through stage 4 chronic kidney disease, or unspecified chronic kidney disease: Principal | ICD-10-CM | POA: Diagnosis present

## 2018-01-03 DIAGNOSIS — Z6838 Body mass index (BMI) 38.0-38.9, adult: Secondary | ICD-10-CM

## 2018-01-03 DIAGNOSIS — J9601 Acute respiratory failure with hypoxia: Secondary | ICD-10-CM | POA: Diagnosis present

## 2018-01-03 DIAGNOSIS — Z882 Allergy status to sulfonamides status: Secondary | ICD-10-CM | POA: Diagnosis not present

## 2018-01-03 DIAGNOSIS — I5033 Acute on chronic diastolic (congestive) heart failure: Secondary | ICD-10-CM

## 2018-01-03 DIAGNOSIS — I10 Essential (primary) hypertension: Secondary | ICD-10-CM | POA: Diagnosis not present

## 2018-01-03 DIAGNOSIS — F419 Anxiety disorder, unspecified: Secondary | ICD-10-CM | POA: Diagnosis present

## 2018-01-03 DIAGNOSIS — I2510 Atherosclerotic heart disease of native coronary artery without angina pectoris: Secondary | ICD-10-CM | POA: Diagnosis not present

## 2018-01-03 DIAGNOSIS — Z888 Allergy status to other drugs, medicaments and biological substances status: Secondary | ICD-10-CM

## 2018-01-03 DIAGNOSIS — N183 Chronic kidney disease, stage 3 (moderate): Secondary | ICD-10-CM | POA: Diagnosis present

## 2018-01-03 DIAGNOSIS — J9600 Acute respiratory failure, unspecified whether with hypoxia or hypercapnia: Secondary | ICD-10-CM | POA: Diagnosis present

## 2018-01-03 DIAGNOSIS — E1122 Type 2 diabetes mellitus with diabetic chronic kidney disease: Secondary | ICD-10-CM | POA: Diagnosis not present

## 2018-01-03 DIAGNOSIS — Z880 Allergy status to penicillin: Secondary | ICD-10-CM

## 2018-01-03 DIAGNOSIS — I5023 Acute on chronic systolic (congestive) heart failure: Secondary | ICD-10-CM | POA: Diagnosis not present

## 2018-01-03 DIAGNOSIS — R079 Chest pain, unspecified: Secondary | ICD-10-CM | POA: Diagnosis not present

## 2018-01-03 DIAGNOSIS — J069 Acute upper respiratory infection, unspecified: Secondary | ICD-10-CM | POA: Diagnosis present

## 2018-01-03 DIAGNOSIS — E1142 Type 2 diabetes mellitus with diabetic polyneuropathy: Secondary | ICD-10-CM | POA: Diagnosis present

## 2018-01-03 DIAGNOSIS — Z885 Allergy status to narcotic agent status: Secondary | ICD-10-CM | POA: Diagnosis not present

## 2018-01-03 DIAGNOSIS — R0602 Shortness of breath: Secondary | ICD-10-CM | POA: Diagnosis not present

## 2018-01-03 DIAGNOSIS — I110 Hypertensive heart disease with heart failure: Secondary | ICD-10-CM | POA: Diagnosis not present

## 2018-01-03 DIAGNOSIS — G4733 Obstructive sleep apnea (adult) (pediatric): Secondary | ICD-10-CM | POA: Diagnosis present

## 2018-01-03 DIAGNOSIS — I739 Peripheral vascular disease, unspecified: Secondary | ICD-10-CM | POA: Diagnosis present

## 2018-01-03 HISTORY — DX: Unspecified systolic (congestive) heart failure: I50.20

## 2018-01-03 HISTORY — DX: Acute myocardial infarction, unspecified: I21.9

## 2018-01-03 HISTORY — DX: Essential (primary) hypertension: I10

## 2018-01-03 HISTORY — DX: Cardiomyopathy, unspecified: I42.9

## 2018-01-03 HISTORY — DX: Acute respiratory failure, unspecified whether with hypoxia or hypercapnia: J96.00

## 2018-01-03 LAB — CBC
HEMATOCRIT: 33.9 % — AB (ref 36.0–46.0)
Hemoglobin: 10.7 g/dL — ABNORMAL LOW (ref 12.0–15.0)
MCH: 27.2 pg (ref 26.0–34.0)
MCHC: 31.6 g/dL (ref 30.0–36.0)
MCV: 86 fL (ref 78.0–100.0)
PLATELETS: 369 10*3/uL (ref 150–400)
RBC: 3.94 MIL/uL (ref 3.87–5.11)
RDW: 13.9 % (ref 11.5–15.5)
WBC: 12.3 10*3/uL — AB (ref 4.0–10.5)

## 2018-01-03 LAB — CBG MONITORING, ED
GLUCOSE-CAPILLARY: 112 mg/dL — AB (ref 65–99)
Glucose-Capillary: 171 mg/dL — ABNORMAL HIGH (ref 65–99)

## 2018-01-03 LAB — BASIC METABOLIC PANEL
ANION GAP: 10 (ref 5–15)
BUN: 9 mg/dL (ref 6–20)
CHLORIDE: 107 mmol/L (ref 101–111)
CO2: 22 mmol/L (ref 22–32)
Calcium: 8.6 mg/dL — ABNORMAL LOW (ref 8.9–10.3)
Creatinine, Ser: 0.86 mg/dL (ref 0.44–1.00)
GFR calc non Af Amer: >60 mL/min (ref >60)
Glucose, Bld: 160 mg/dL — ABNORMAL HIGH (ref 65–99)
POTASSIUM: 3.5 mmol/L (ref 3.5–5.1)
SODIUM: 139 mmol/L (ref 135–145)

## 2018-01-03 LAB — I-STAT TROPONIN, ED: Troponin i, poc: 0.03 ng/mL (ref 0.00–0.08)

## 2018-01-03 LAB — BRAIN NATRIURETIC PEPTIDE: B NATRIURETIC PEPTIDE 5: 717.7 pg/mL — AB (ref 0.0–100.0)

## 2018-01-03 LAB — TROPONIN I
Troponin I: 0.51 ng/mL (ref <0.03)
Troponin I: 0.5 ng/mL (ref <0.03)

## 2018-01-03 LAB — INFLUENZA PANEL BY PCR (TYPE A & B)
INFLAPCR: NEGATIVE
INFLBPCR: NEGATIVE

## 2018-01-03 MED ORDER — CARVEDILOL 12.5 MG PO TABS
12.5000 mg | ORAL_TABLET | Freq: Two times a day (BID) | ORAL | Status: DC
  Administered 2018-01-03 – 2018-01-05 (×5): 12.5 mg via ORAL
  Filled 2018-01-03 (×5): qty 1

## 2018-01-03 MED ORDER — ROPINIROLE HCL 1 MG PO TABS: 2.0000 mg | ORAL_TABLET | Freq: Every day | ORAL | Status: DC

## 2018-01-03 MED ORDER — SODIUM CHLORIDE 0.9 % IV SOLN: 250.0000 mL | INTRAVENOUS | Status: DC | PRN

## 2018-01-03 MED ORDER — FUROSEMIDE 10 MG/ML IJ SOLN
40.0000 mg | Freq: Once | INTRAMUSCULAR | Status: AC
  Administered 2018-01-03: 40 mg via INTRAVENOUS
  Filled 2018-01-03: qty 4

## 2018-01-03 MED ORDER — ROPINIROLE HCL 1 MG PO TABS
2.0000 mg | ORAL_TABLET | Freq: Every day | ORAL | Status: DC
  Administered 2018-01-03 – 2018-01-04 (×2): 2 mg via ORAL
  Filled 2018-01-03 (×2): qty 2

## 2018-01-03 MED ORDER — SACUBITRIL-VALSARTAN 24-26 MG PO TABS
1.0000 | ORAL_TABLET | Freq: Two times a day (BID) | ORAL | Status: DC
  Administered 2018-01-04 – 2018-01-05 (×3): 1 via ORAL
  Filled 2018-01-03 (×4): qty 1

## 2018-01-03 MED ORDER — DARIFENACIN HYDROBROMIDE ER 7.5 MG PO TB24
7.5000 mg | ORAL_TABLET | Freq: Every day | ORAL | Status: DC
  Administered 2018-01-05: 7.5 mg via ORAL
  Filled 2018-01-03 (×3): qty 1

## 2018-01-03 MED ORDER — INSULIN GLARGINE 100 UNIT/ML ~~LOC~~ SOLN
20.0000 [IU] | Freq: Every day | SUBCUTANEOUS | Status: DC
  Administered 2018-01-03 – 2018-01-04 (×2): 20 [IU] via SUBCUTANEOUS
  Filled 2018-01-03 (×2): qty 0.2

## 2018-01-03 MED ORDER — INSULIN ASPART 100 UNIT/ML ~~LOC~~ SOLN: 0.0000 [IU] | Freq: Every day | SUBCUTANEOUS | Status: DC

## 2018-01-03 MED ORDER — ACETAMINOPHEN 325 MG PO TABS: 650.0000 mg | ORAL_TABLET | ORAL | Status: DC | PRN

## 2018-01-03 MED ORDER — SODIUM CHLORIDE 0.9% FLUSH
3.0000 mL | Freq: Two times a day (BID) | INTRAVENOUS | Status: DC
  Administered 2018-01-03 – 2018-01-05 (×4): 3 mL via INTRAVENOUS

## 2018-01-03 MED ORDER — FUROSEMIDE 10 MG/ML IJ SOLN
40.0000 mg | Freq: Two times a day (BID) | INTRAMUSCULAR | Status: DC
  Administered 2018-01-03 – 2018-01-05 (×4): 40 mg via INTRAVENOUS
  Filled 2018-01-03 (×4): qty 4

## 2018-01-03 MED ORDER — ENOXAPARIN SODIUM 40 MG/0.4ML ~~LOC~~ SOLN
40.0000 mg | SUBCUTANEOUS | Status: DC
  Administered 2018-01-03 – 2018-01-04 (×2): 40 mg via SUBCUTANEOUS
  Filled 2018-01-03 (×3): qty 0.4

## 2018-01-03 MED ORDER — ATORVASTATIN CALCIUM 80 MG PO TABS
80.0000 mg | ORAL_TABLET | Freq: Every day | ORAL | Status: DC
  Administered 2018-01-03 – 2018-01-04 (×2): 80 mg via ORAL
  Filled 2018-01-03 (×2): qty 1

## 2018-01-03 MED ORDER — INSULIN ASPART 100 UNIT/ML ~~LOC~~ SOLN
0.0000 [IU] | Freq: Three times a day (TID) | SUBCUTANEOUS | Status: DC
  Administered 2018-01-04 (×2): 1 [IU] via SUBCUTANEOUS
  Administered 2018-01-04: 2 [IU] via SUBCUTANEOUS
  Administered 2018-01-05: 1 [IU] via SUBCUTANEOUS
  Administered 2018-01-05: 2 [IU] via SUBCUTANEOUS

## 2018-01-03 MED ORDER — SODIUM CHLORIDE 0.9% FLUSH
3.0000 mL | INTRAVENOUS | Status: DC | PRN
  Administered 2018-01-03: 3 mL via INTRAVENOUS

## 2018-01-03 MED ORDER — ASPIRIN EC 81 MG PO TBEC
81.0000 mg | DELAYED_RELEASE_TABLET | Freq: Every day | ORAL | Status: DC
  Administered 2018-01-03 – 2018-01-05 (×3): 81 mg via ORAL
  Filled 2018-01-03 (×3): qty 1

## 2018-01-03 MED ORDER — TICAGRELOR 90 MG PO TABS
90.0000 mg | ORAL_TABLET | Freq: Two times a day (BID) | ORAL | Status: DC
  Administered 2018-01-03 – 2018-01-05 (×4): 90 mg via ORAL
  Filled 2018-01-03 (×4): qty 1

## 2018-01-03 MED ORDER — ONDANSETRON HCL 4 MG/2ML IJ SOLN: 4.0000 mg | Freq: Four times a day (QID) | INTRAMUSCULAR | Status: DC | PRN

## 2018-01-03 MED ORDER — NITROGLYCERIN 0.4 MG SL SUBL: 0.4000 mg | SUBLINGUAL_TABLET | SUBLINGUAL | Status: DC | PRN

## 2018-01-03 MED ORDER — SERTRALINE HCL 100 MG PO TABS
100.0000 mg | ORAL_TABLET | Freq: Every day | ORAL | Status: DC
  Administered 2018-01-03 – 2018-01-05 (×3): 100 mg via ORAL
  Filled 2018-01-03 (×4): qty 1

## 2018-01-03 NOTE — H&P (Signed)
History and Physical    Ilean Spradlin NFA:[id number] DOB: [birth year] DOA: 01/03/2018  PCP: Pearson Grippe, MD Patient coming from: home  Chief Complaint: sob/cough  HPI: Greydis Stlouis is a 72 y.o. female with medical history significant for STEMI 18 months ago, diabetes, hypertension, peripheral artery disease, CAD,*card failure, presents to the emergency Department chief complaint persistent worsening shortness of breath. Initial evaluation reveals acute respiratory failure with hypoxia and chest x-ray concerning for acute on chronic systolic heart failure. [facility] are asked to admit  Information is obtained from the patient. She states over the last several days she's had "a cold". She describes nasal congestion intermittent fever. She took aspirin with little relief. 2 days ago she developed cough and shortness of breath. She states the shortness of breath is worse with exertion. Associated symptoms include worsening lower extremity edema. She reports she took one of her when necessary Lasix at the beginning of the week but she did not urinate per her norm. Denies monitoring her daily weight. This morning she found it difficult to breathe called EMS. She denies chest pain palpitation headache dizziness syncope or near-syncope. She denies nausea vomiting abdominal pain diarrhea constipation melena bright red blood per rectum. She denies any dysuria hematuria frequency or urgency. He denies numbness tingling of extremities. She states her grandchildren and children "have all been sick".   ED Course: In the emergency department max temperature 99.4 blood pressure high end of normal mild tachypnea. She is angulated in the hallway oxygen saturation level dropped to 88%. Is provided with 40 mg of Lasix IV.  Review of Systems: As per HPI otherwise all other systems reviewed and are negative.   Ambulatory Status: Ambulates Independently is independent with ADLs  Past Medical History:  Diagnosis  Date  . Acute respiratory failure (HCC)   . Cardiomyopathy (HCC)   . Hypertension   . MI (myocardial infarction) (HCC)   . Systolic heart failure [facility])     Past Surgical History:  Procedure Laterality Date  . ABDOMINAL AORTOGRAM N/A 02/21/2017   Procedure: Abdominal Aortogram;  Surgeon: Yates Decamp, MD;  Location: [facility] INVASIVE CV LAB;  Service: Cardiovascular;  Laterality: N/A;  . LOWER EXTREMITY ANGIOGRAPHY N/A 02/21/2017   Procedure: Lower Extremity Angiography;  Surgeon: Yates Decamp, MD;  Location: [facility] INVASIVE CV LAB;  Service: Cardiovascular;  Laterality: N/A;  . PERIPHERAL VASCULAR INTERVENTION  02/21/2017   Procedure: Peripheral Vascular Intervention;  Surgeon: Yates Decamp, MD;  Location: [facility] INVASIVE CV LAB;  Service: Cardiovascular;;    Social History   Socioeconomic History  . Marital status: Widowed    Spouse name: Not on file  . Number of children: Not on file  . Years of education: Not on file  . Highest education level: Not on file  Social Needs  . Financial resource strain: Not on file  . Food insecurity - worry: Not on file  . Food insecurity - inability: Not on file  . Transportation needs - medical: Not on file  . Transportation needs - non-medical: Not on file  Occupational History  . Not on file  Tobacco Use  . Smoking status: Unknown If Ever Smoked  . Smokeless tobacco: Never Used  Substance and Sexual Activity  . Alcohol use: Not on file  . Drug use: Not on file  . Sexual activity: Not on file  Other Topics Concern  . Not on file  Social History Narrative  . Not on file    Allergies  Allergen Reactions  .  Penicillins Other (See Comments)    Has patient had a PCN reaction causing immediate rash, facial/tongue/throat swelling, SOB or lightheadedness with hypotension: unknown  Has patient had a PCN reaction causing severe rash involving mucus membranes or skin necrosis: unknown Has patient had a PCN reaction that required hospitalization {unknown  Has  patient had a PCN reaction occurring within the last 10 years: No If all of the above answers are "NO", then may proceed with Cephalosporin use.   Marland Kitchen Percodan [Oxycodone-Aspirin] Other (See Comments)    Altered mental status  . Sulfa Antibiotics Other (See Comments)    unknown  . Epinephrine Other (See Comments) and Palpitations    Tachycardia Heart palpations    No family history on file.  Prior to Admission medications   Medication Sig Start Date End Date Taking? Authorizing Provider  aspirin EC 81 MG tablet Take 81 mg by mouth daily.   Yes [provider]  atorvastatin (LIPITOR) 80 MG tablet Take 80 mg by mouth daily at 6 PM.   Yes [provider]  carvedilol (COREG) 12.5 MG tablet Take 12.5 mg by mouth 2 (two) times daily. 09/04/17  Yes [provider]  Dulaglutide (TRULICITY) 1.5 MG/0.5ML SOPN Inject 1.5 mg into the skin every 7 (seven) days. 09/04/17  Yes [provider]  furosemide (LASIX) 40 MG tablet Take 40 mg by mouth daily as needed for fluid or edema (1-2 tablets depends how much fluid).    Yes [provider]  insulin glargine (LANTUS) 100 UNIT/ML injection Inject 30 Units into the skin 2 (two) times daily.   Yes [provider]  rOPINIRole (REQUIP) 2 MG tablet Take 2 mg by mouth at bedtime.   Yes [provider]  sacubitril-valsartan (ENTRESTO) 24-26 MG Take 1 tablet by mouth 2 (two) times daily.   Yes [provider]  sertraline (ZOLOFT) 100 MG tablet Take 100 mg by mouth daily.   Yes [provider]  solifenacin (VESICARE) 5 MG tablet Take 5 mg by mouth every evening.   Yes [provider]  ticagrelor (BRILINTA) 90 MG TABS tablet Take 90 mg by mouth every evening.   Yes [provider]  nitroGLYCERIN (NITROSTAT) 0.4 MG SL tablet Place 0.4 mg under the tongue every 5 (five) minutes as needed for chest pain.    [provider]    Physical Exam: Vitals:   01/03/18  1129 01/03/18 1305 01/03/18 1430 01/03/18 1515  BP: 137/61 (!) 147/52 136/63 133/65  Pulse: 80 78 77 76  Resp: 16 (!) 22 15 19   Temp: 98.3 F (36.8 C)     TempSrc: Oral     SpO2: 94% 95% 99% 97%     General:  Appears calm and comfortable, sitting up in bed watching television in no acute distress Eyes:  PERRL, EOMI, normal lids, iris ENT:  grossly normal hearing, lips & tongue, mucous membranes of her mouth are dry but pink Neck:  no LAD, masses or thyromegaly Cardiovascular:  RRR, no m/r/g. Trace lower extremity edema in the left. Respiratory:  Only mild increased work of breathing with conversation. Frequent dry cough during exam. Breath sounds are distant/diminished in bases otherwise clear Abdomen:  soft, ntnd, obese positive bowel sounds throughout no guarding or rebounding Skin:  no rash or induration seen on limited exam Musculoskeletal:  grossly normal tone BUE/BLE, good ROM, no bony abnormality Psychiatric:  grossly normal mood and affect, speech fluent and appropriate, AOx3 Neurologic:  CN 2-12 grossly intact, moves  all extremities in coordinated fashion, sensation intact speech clear facial symmetry  Labs on Admission: I have personally reviewed following labs and imaging studies  CBC: Recent Labs  Lab 01/03/18 0108  WBC 12.3*  HGB 10.7*  HCT 33.9*  MCV 86.0  PLT 369   Basic Metabolic Panel: Recent Labs  Lab 01/03/18 0108  NA 139  K 3.5  CL 107  CO2 22  GLUCOSE 160*  BUN 9  CREATININE 0.86  CALCIUM 8.6*   GFR: CrCl cannot be calculated (Unknown ideal weight.). Liver Function Tests: No results for input(s): AST, ALT, ALKPHOS, BILITOT, PROT, ALBUMIN in the last 168 hours. No results for input(s): LIPASE, AMYLASE in the last 168 hours. No results for input(s): AMMONIA in the last 168 hours. Coagulation Profile: No results for input(s): INR, PROTIME in the last 168 hours. Cardiac Enzymes: No results for input(s): CKTOTAL, CKMB, CKMBINDEX, TROPONINI in  the last 168 hours. BNP (last 3 results) No results for input(s): PROBNP in the last 8760 hours. HbA1C: No results for input(s): HGBA1C in the last 72 hours. CBG: No results for input(s): GLUCAP in the last 168 hours. Lipid Profile: No results for input(s): CHOL, HDL, LDLCALC, TRIG, CHOLHDL, LDLDIRECT in the last 72 hours. Thyroid Function Tests: No results for input(s): TSH, T4TOTAL, FREET4, T3FREE, THYROIDAB in the last 72 hours. Anemia Panel: No results for input(s): VITAMINB12, FOLATE, FERRITIN, TIBC, IRON, RETICCTPCT in the last 72 hours. Urine analysis: No results found for: COLORURINE, APPEARANCEUR, LABSPEC, PHURINE, GLUCOSEU, HGBUR, BILIRUBINUR, KETONESUR, PROTEINUR, UROBILINOGEN, NITRITE, LEUKOCYTESUR  Creatinine Clearance: CrCl cannot be calculated (Unknown ideal weight.).  Sepsis Labs: @LABRCNTIP (procalcitonin:4,lacticidven:4) )No results found for this or any previous visit (from the past 240 hour(s)).   Radiological Exams on Admission: Dg Chest 2 View  Result Date: 01/03/2018 CLINICAL DATA:  Chest pain and shortness of breath tonight. EXAM: CHEST  2 VIEW COMPARISON:  None. FINDINGS: Mild cardiomegaly with normal mediastinal contours. Interstitial opacities suspicious for pulmonary edema. Small bilateral pleural effusions and fluid in the fissures. No confluent airspace disease. No pneumothorax. Right shoulder periarticular calcifications. There is degenerative change in the spine. IMPRESSION: Interstitial opacities suspicious for pulmonary edema with bilateral pleural effusions consistent with CHF. Electronically Signed   By: Rubye Oaks M.D.   On: 01/03/2018 01:55    EKG: Independently reviewed. NSR Left bundle branch block Abnormal ECG No old tracing to compare  Assessment/Plan Principal Problem:   Acute respiratory failure (HCC) Active Problems:   Peripheral arterial disease (HCC)   Coronary artery disease involving native coronary artery of native heart  without angina pectoris   Ischemic cardiomyopathy   Acute systolic CHF (congestive heart failure) (HCC)   Essential hypertension   Mixed hyperlipidemia   Severe obesity (BMI 35.0-39.9) with comorbidity (HCC)   DM type 2 with diabetic peripheral neuropathy (HCC)   Pleural effusion   #1. Acute respiratory failure with hypoxia likely related to acute on chronic systolic heart failure. Oxygen saturation level dropped 88% with ambulation on room air. Chest x-ray reveals interstitial opacities suspicious for pulmonary edema with bilateral pleural effusions. BNP 717. At time of admission oxygen saturation level greater than 90% on 2 L nasal cannula -Admit inpatient telemetry -We'll continue Lasix 40 mg twice a day -Continue oxygen supplementation -Monitor oxygen saturation level -Intake and output -Daily weights -wean oxygen as able  #2. Acute on chronic systolic heart failure/ischemic cardiomyopathy. BNP 717. Chest x-ray as noted above. Sensation so she had an echo in [place] fairly recently and her  left improved 45%. He states her Lasix as prescribed as needed. She took one dose at the beginning of the week. Prior to that she hesitated take Lasix as it has led to syncopal episodes for her in the past. Home medications include or coreg, valsartan, -continue homemeds -Obtain a 2-D echo -Monitor intake and output -Obtain daily weights -Lasix as noted above -Likely need outpatient follow-up with her cardiologist Ganji  #3. Diabetes. Serum glucose 160 on admission. Home medications include Lantus and trulicity.  -Continue Lantus at a slightly lower dose -Hold for less than 2 for now -Obtain a hemoglobin A1c -Sliding scale insulin for optimal control  #4. CAD/hyperlipidemia no chest pain. Initial troponin negative. EKG as noted above. No chest pain. Cardiologist Dr. Jacinto HalimGanji. See note dated February 2018 -Continue home meds -Cycle troponins  #5. Hypertension. Fair control in the emergency  department. -Lasix IV as noted above -Continue home meds -monitor   DVT prophylaxis: lovenox Code Status: full  Family Communication: none present  Disposition Plan: home when ready  Consults called: none  Admission status: inpatient    Gwenyth BenderBLACK,Mahoganie Basher M MD [facility]  If 7PM-7AM, please contact night-coverage [email or web address] Password [email or web address]  01/03/2018, 4:48 PM

## 2018-01-03 NOTE — ED Notes (Signed)
Pt up too restroom and back treatment room. No issues or difficulties.

## 2018-01-03 NOTE — ED Notes (Signed)
Care handoff to Callie RN 

## 2018-01-03 NOTE — ED Notes (Signed)
Admitting provider at bedside.

## 2018-01-03 NOTE — ED Notes (Signed)
Christine CharterYates, MD informed of elevated troponin 0.51 verbal order to place trops Q 6 hrs

## 2018-01-03 NOTE — ED Triage Notes (Signed)
Pt to ED via [facility] with c/o shortness of breath and flu like symptoms.  Pr denies any chest pain.  Pt was given Albuterol 5mg  HHN enroute to ED

## 2018-01-03 NOTE — ED Notes (Signed)
PT does not wish to get into a gown. PT states, "they told me I'm just here for my results"

## 2018-01-03 NOTE — ED Provider Notes (Signed)
[facility] Provider Note   CSN: [id number] Arrival date & time: 01/03/18  0025     History   Chief Complaint Chief Complaint  Patient presents with  . Shortness of Breath    HPI Christine Levy is a 72 y.o. female.  HPI  Patient with history of MI and CHF presenting with complaint of worsening shortness of breath over the past week.  Patient states she has had some cold symptoms with nasal congestion and yesterday developed a cough.  She has also had worsening shortness of breath with taking a few steps and lying flat.  She denies any lower extremity swelling.  She does take Lasix as needed for fluid overload.  She has not had any chest pain.  She did have a fever at the beginning of her illness a week ago. There are no other associated systemic symptoms, there are no other alleviating or modifying factors.   Past Medical History:  Diagnosis Date  . Acute respiratory failure (HCC)   . Cardiomyopathy (HCC)   . Hypertension   . MI (myocardial infarction) (HCC)   . Systolic heart failure [facility](HCC)     Patient Active Problem List   Diagnosis Date Noted  . Acute respiratory failure (HCC) 01/03/2018  . Pleural effusion 01/03/2018  . DM type 2 with diabetic peripheral neuropathy (HCC) 10/30/2017  . Onychomycosis of great toe 10/04/2017  . Peripheral arterial disease (HCC) 02/20/2017  . Coronary artery disease involving native coronary artery of native heart without angina pectoris 02/20/2017  . Ischemic cardiomyopathy 02/20/2017  . Acute heart failure (HCC) 10/12/2016  . Acute systolic CHF (congestive heart failure) (HCC) 09/29/2016  . STEMI (ST elevation myocardial infarction) (HCC) 09/29/2016  . Depression 10/27/2015  . Essential hypertension 10/27/2015  . Fatigue 10/27/2015  . Inflammation of eyelid 10/27/2015  . Mixed hyperlipidemia 10/27/2015  . Obstructive sleep apnea syndrome 10/27/2015  . Osteopenia 10/27/2015  . Overactive bladder  10/27/2015  . Restless legs syndrome 10/27/2015  . Right upper quadrant abdominal pain 10/27/2015  . Severe obesity (BMI 35.0-39.9) with comorbidity (HCC) 10/27/2015  . Sleep apnea 10/27/2015  . Type 2 diabetes mellitus with diabetic peripheral angiopathy without gangrene (HCC) 10/27/2015    Past Surgical History:  Procedure Laterality Date  . ABDOMINAL AORTOGRAM N/A 02/21/2017   Procedure: Abdominal Aortogram;  Surgeon: Yates DecampJay Ganji, MD;  [facility];  Service: Cardiovascular;  Laterality: N/A;  . LOWER EXTREMITY ANGIOGRAPHY N/A 02/21/2017   Procedure: Lower Extremity Angiography;  Surgeon: Yates DecampJay Ganji, MD;  [facility];  Service: Cardiovascular;  Laterality: N/A;  . PERIPHERAL VASCULAR INTERVENTION  02/21/2017   Procedure: Peripheral Vascular Intervention;  Surgeon: Yates DecampJay Ganji, MD;  [facility];  Service: Cardiovascular;;    OB History    No data available       Home Medications    Prior to Admission medications   Medication Sig Start Date End Date Taking? Authorizing Provider  aspirin EC 81 MG tablet Take 81 mg by mouth daily.   Yes [provider]  atorvastatin (LIPITOR) 80 MG tablet Take 80 mg by mouth daily at 6 PM.   Yes [provider]  carvedilol (COREG) 12.5 MG tablet Take 12.5 mg by mouth 2 (two) times daily. 09/04/17  Yes [provider]  Dulaglutide (TRULICITY) 1.5 MG/0.5ML SOPN Inject 1.5 mg into the skin every 7 (seven) days. 09/04/17  Yes [provider]  furosemide (LASIX) 40 MG tablet Take  40 mg by mouth daily as needed for fluid or edema (1-2 tablets depends how much fluid).    Yes [provider]  insulin glargine (LANTUS) 100 UNIT/ML injection Inject 30 Units into the skin 2 (two) times daily.   Yes [provider]  rOPINIRole (REQUIP) 2 MG tablet Take 2 mg by mouth at bedtime.   Yes [provider]  sacubitril-valsartan (ENTRESTO) 24-26 MG Take 1 tablet by  mouth 2 (two) times daily.   Yes [provider]  sertraline (ZOLOFT) 100 MG tablet Take 100 mg by mouth daily.   Yes [provider]  solifenacin (VESICARE) 5 MG tablet Take 5 mg by mouth every evening.   Yes [provider]  ticagrelor (BRILINTA) 90 MG TABS tablet Take 90 mg by mouth every evening.   Yes [provider]  nitroGLYCERIN (NITROSTAT) 0.4 MG SL tablet Place 0.4 mg under the tongue every 5 (five) minutes as needed for chest pain.    [provider]    Family History No family history on file.  Social History Social History   Tobacco Use  . Smoking status: Unknown If Ever Smoked  . Smokeless tobacco: Never Used  Substance Use Topics  . Alcohol use: Not on file  . Drug use: Not on file     Allergies   Penicillins; Percodan [oxycodone-aspirin]; Sulfa antibiotics; and Epinephrine   Review of Systems Review of Systems  ROS reviewed and all otherwise negative except for mentioned in HPI   Physical Exam Updated Vital Signs BP 133/65   Pulse 76   Temp 98.3 F (36.8 C) (Oral)   Resp 19   SpO2 97%  Vitals reviewed Physical Exam  Physical Examination: General appearance - alert, well appearing, and in no distress Mental status - alert, oriented to person, place, and time Eyes - no conjunctival injection, no scleral icterus Mouth - mucous membranes moist, pharynx normal without lesions Neck - supple, no significant adenopathy Chest - decreased air movement bilaterally but clear to auscultation, no wheezes, rales or rhonchi, symmetric air entry Heart - normal rate, regular rhythm, normal S1, S2, no murmurs, rubs, clicks or gallops Abdomen - soft, nontender, nondistended, no masses or organomegaly Neurological - alert, oriented, normal speech, Extremities - peripheral pulses normal, no pedal edema, no clubbing or cyanosis Skin - normal coloration and turgor, no rashes,   ED Treatments / Results  Labs (all labs ordered  are listed, but only abnormal results are displayed) Labs Reviewed  BASIC METABOLIC PANEL - Abnormal; Notable for the following components:      Result Value   Glucose, Bld 160 (*)    Calcium 8.6 (*)    All other components within normal limits  CBC - Abnormal; Notable for the following components:   WBC 12.3 (*)    Hemoglobin 10.7 (*)    HCT 33.9 (*)    All other components within normal limits  BRAIN NATRIURETIC PEPTIDE - Abnormal; Notable for the following components:   B Natriuretic Peptide 717.7 (*)    All other components within normal limits  HEMOGLOBIN A1C  INFLUENZA PANEL BY PCR (TYPE A & B)  TROPONIN I  I-STAT TROPONIN, ED    EKG  EKG Interpretation  Date/Time:  Wednesday January 03 2018 00:31:52 EST Ventricular Rate:  86 PR Interval:  154 QRS Duration: 136 QT Interval:  418 QTC Calculation: 500 R Axis:   9 Text Interpretation:  Normal sinus rhythm Left bundle branch block Abnormal ECG No old  tracing to compare Confirmed by Jerelyn Scott [phone]) on 01/03/2018 12:12:42 PM       Radiology Dg Chest 2 View  Result Date: 01/03/2018 CLINICAL DATA:  Chest pain and shortness of breath tonight. EXAM: CHEST  2 VIEW COMPARISON:  None. FINDINGS: Mild cardiomegaly with normal mediastinal contours. Interstitial opacities suspicious for pulmonary edema. Small bilateral pleural effusions and fluid in the fissures. No confluent airspace disease. No pneumothorax. Right shoulder periarticular calcifications. There is degenerative change in the spine. IMPRESSION: Interstitial opacities suspicious for pulmonary edema with bilateral pleural effusions consistent with CHF. Electronically Signed   By: Rubye Oaks M.D.   On: 01/03/2018 01:55    Procedures Procedures (including critical care time)  Medications Ordered in ED Medications  aspirin EC tablet 81 mg (not administered)  atorvastatin (LIPITOR) tablet 80 mg (not administered)  carvedilol (COREG) tablet 12.5 mg (not  administered)  nitroGLYCERIN (NITROSTAT) SL tablet 0.4 mg (not administered)  rOPINIRole (REQUIP) tablet 2 mg (not administered)  sertraline (ZOLOFT) tablet 100 mg (not administered)  darifenacin (ENABLEX) 24 hr tablet 7.5 mg (not administered)  ticagrelor (BRILINTA) tablet 90 mg (not administered)  sodium chloride flush (NS) 0.9 % injection 3 mL (not administered)  sodium chloride flush (NS) 0.9 % injection 3 mL (not administered)  0.9 %  sodium chloride infusion (not administered)  acetaminophen (TYLENOL) tablet 650 mg (not administered)  ondansetron (ZOFRAN) injection 4 mg (not administered)  enoxaparin (LOVENOX) injection 40 mg (not administered)  furosemide (LASIX) injection 40 mg (not administered)  sacubitril-valsartan (ENTRESTO) 24-26 mg per tablet (not administered)  insulin aspart (novoLOG) injection 0-9 Units (not administered)  insulin glargine (LANTUS) injection 20 Units (not administered)  insulin aspart (novoLOG) injection 0-5 Units (not administered)  furosemide (LASIX) injection 40 mg (40 mg Intravenous Given 01/03/18 1321)     Initial Impression / Assessment and Plan / ED Course  I have reviewed the triage vital signs and the nursing notes.  Pertinent labs & imaging results that were available during my care of the patient were reviewed by me and considered in my medical decision making (see chart for details).     Patient presenting with complaint of shortness of breath.  She has a history of CHF and takes Lasix on a as needed basis.  Her symptoms were preceded by URI symptoms last week.  On exam she is short of breath with minimal exertion on the stretcher and with small attempts at ambulating.  Her chest x-ray is consistent with some pulmonary edema.  EKG and troponin are reassuring.  Her BNP is elevated over 700.  Patient treated with IV Lasix.  Discussed with hospitalist team for admission and further management for diuresis.  Final Clinical Impressions(s) / ED  Diagnoses   Final diagnoses:  Acute on chronic systolic congestive heart failure [facility])    ED Discharge Orders    None       Phillis Haggis, MD 01/03/18 1651

## 2018-01-03 NOTE — ED Notes (Signed)
PAGED ADMITTING PER RN  

## 2018-01-03 NOTE — ED Notes (Signed)
Ambulated pt in hallway. Pt has steady gait pt denies dizziness. Pt o2 sat on RA 89-90% HR 90.

## 2018-01-03 NOTE — ED Notes (Signed)
Pt given dinner tray.

## 2018-01-04 ENCOUNTER — Other Ambulatory Visit: Payer: Self-pay

## 2018-01-04 ENCOUNTER — Inpatient Hospital Stay (HOSPITAL_COMMUNITY): Payer: Medicare Other

## 2018-01-04 ENCOUNTER — Encounter (HOSPITAL_COMMUNITY): Payer: Self-pay

## 2018-01-04 DIAGNOSIS — I2510 Atherosclerotic heart disease of native coronary artery without angina pectoris: Secondary | ICD-10-CM

## 2018-01-04 DIAGNOSIS — I5023 Acute on chronic systolic (congestive) heart failure: Secondary | ICD-10-CM

## 2018-01-04 DIAGNOSIS — J9601 Acute respiratory failure with hypoxia: Secondary | ICD-10-CM

## 2018-01-04 DIAGNOSIS — E1142 Type 2 diabetes mellitus with diabetic polyneuropathy: Secondary | ICD-10-CM

## 2018-01-04 LAB — GLUCOSE, CAPILLARY
GLUCOSE-CAPILLARY: 136 mg/dL — AB (ref 65–99)
Glucose-Capillary: 141 mg/dL — ABNORMAL HIGH (ref 65–99)
Glucose-Capillary: 177 mg/dL — ABNORMAL HIGH (ref 65–99)
Glucose-Capillary: 153 mg/dL — ABNORMAL HIGH (ref 65–99)

## 2018-01-04 LAB — RESPIRATORY PANEL BY PCR
Adenovirus: NOT DETECTED
Bordetella pertussis: NOT DETECTED
CHLAMYDOPHILA PNEUMONIAE-RVPPCR: NOT DETECTED
CORONAVIRUS HKU1-RVPPCR: NOT DETECTED
CORONAVIRUS NL63-RVPPCR: NOT DETECTED
CORONAVIRUS OC43-RVPPCR: NOT DETECTED
Coronavirus 229E: NOT DETECTED
INFLUENZA A-RVPPCR: NOT DETECTED
Influenza B: NOT DETECTED
METAPNEUMOVIRUS-RVPPCR: NOT DETECTED
Mycoplasma pneumoniae: NOT DETECTED
PARAINFLUENZA VIRUS 2-RVPPCR: NOT DETECTED
PARAINFLUENZA VIRUS 3-RVPPCR: NOT DETECTED
PARAINFLUENZA VIRUS 4-RVPPCR: NOT DETECTED
Parainfluenza Virus 1: NOT DETECTED
RHINOVIRUS / ENTEROVIRUS - RVPPCR: NOT DETECTED
Respiratory Syncytial Virus: NOT DETECTED

## 2018-01-04 LAB — HEMOGLOBIN A1C
HEMOGLOBIN A1C: 5.8 % — AB (ref 4.8–5.6)
Mean Plasma Glucose: 119.76 mg/dL

## 2018-01-04 LAB — BASIC METABOLIC PANEL
Anion gap: 11 (ref 5–15)
BUN: 13 mg/dL (ref 6–20)
CALCIUM: 8.3 mg/dL — AB (ref 8.9–10.3)
CO2: 26 mmol/L (ref 22–32)
CREATININE: 0.96 mg/dL (ref 0.44–1.00)
Chloride: 102 mmol/L (ref 101–111)
GFR calc non Af Amer: 58 mL/min — ABNORMAL LOW (ref >60)
Glucose, Bld: 126 mg/dL — ABNORMAL HIGH (ref 65–99)
Potassium: 3.1 mmol/L — ABNORMAL LOW (ref 3.5–5.1)
SODIUM: 139 mmol/L (ref 135–145)

## 2018-01-04 LAB — ECHOCARDIOGRAM COMPLETE
HEIGHTINCHES: 61 in
Weight: 3636.71 oz

## 2018-01-04 LAB — TROPONIN I
Troponin I: 0.31 ng/mL (ref <0.03)
Troponin I: 0.22 ng/mL (ref <0.03)

## 2018-01-04 LAB — MAGNESIUM: Magnesium: 1.8 mg/dL (ref 1.7–2.4)

## 2018-01-04 MED ORDER — PNEUMOCOCCAL VAC POLYVALENT 25 MCG/0.5ML IJ INJ
0.5000 mL | INJECTION | INTRAMUSCULAR | Status: AC
Start: 2018-01-05 — End: 2018-01-05
  Administered 2018-01-05: 0.5 mL via INTRAMUSCULAR
  Filled 2018-01-04: qty 0.5

## 2018-01-04 MED ORDER — POTASSIUM CHLORIDE CRYS ER 20 MEQ PO TBCR
40.0000 meq | EXTENDED_RELEASE_TABLET | Freq: Two times a day (BID) | ORAL | Status: DC
  Administered 2018-01-04 – 2018-01-05 (×3): 40 meq via ORAL
  Filled 2018-01-04 (×3): qty 2

## 2018-01-04 NOTE — Progress Notes (Signed)
PROGRESS NOTE    Christine Levy  WJX:[id number] DOB: [birth year] DOA: 01/03/2018 PCP: Pearson Grippe, MD   Brief Narrative: 72 y.o. female with a Past Medical History of CHF (EF roughly 20% in the past but more recently 45-50% in 5/18); CAD; and HTN who presents with SOB.  Also with URI like symptoms for 2- 3 days.  She was found to be hypoxic to 88% ambulatory.  Assessment & Plan:   #Acute on chronic systolic congestive heart failure/ischemic cardiomyopathy: -Chest x-ray with acute pulmonary edema,, elevated troponin. -Continue IV Lasix -Daily weight, strict ins and out -Follow-up echo. -Cardiology consulted and discussed with Dr. Jacinto Halim -Continue current cardiac medication  #Acute respiratory failure with hypoxia in the setting of CHF.  Improving.  On room air. -Respiratory viral study negative.  #Hypokalemia: Replete potassium chloride.  Repeat lab in the morning.  Check magnesium level.  #Coronary artery disease/hyperlipidemia: Continue home medication.  Cycle troponin.  On aspirin, Coreg, Lipitor  #Hypertension: Blood pressure improved.  #anxiety depression: Continue Zoloft.  DVT prophylaxis: Lovenox subcutaneous Code Status: Full code Family Communication: No family at bedside Disposition Plan: Likely discharge home in 1-2 days    Consultants:   Cardiology  Procedures: Pending echo Antimicrobials: None  Subjective: Seen and examined at bedside.  Patient reported shortness of breath is better.  Mild cough.  No chest pain, denied nausea vomiting.  Objective: Vitals:   01/04/18 0100 01/04/18 0431 01/04/18 0917 01/04/18 1016  BP: (!) 126/97 (!) 121/50 (!) 119/54   Pulse: 76 70 73   Resp: (!) 21 17 16    Temp:  98.4 F (36.9 C) 98.6 F (37 C)   TempSrc:  Oral Oral   SpO2: 93% 90% 99%   Weight:    103.1 kg (227 lb 4.7 oz)  Height:    5\' 1"  (1.549 m)    Intake/Output Summary (Last 24 hours) at 01/04/2018 1300 Last data filed at 01/04/2018 0900 Gross per 24 hour    Intake 240 ml  Output 1200 ml  Net -960 ml   Filed Weights   01/04/18 1016  Weight: 103.1 kg (227 lb 4.7 oz)    Examination:  General exam: Appears calm and comfortable  Respiratory system: Bibasilar rhonchi, no wheezing, respiratory effort normal. Cardiovascular system: S1 & S2 heard, RRR.  No pedal edema. Gastrointestinal system: Abdomen is nondistended, soft and nontender. Normal bowel sounds heard. Central nervous system: Alert and oriented. No focal neurological deficits. Extremities: Symmetric 5 x 5 power. Skin: No rashes, lesions or ulcers Psychiatry: Judgement and insight appear normal. Mood & affect appropriate.     Data Reviewed: I have personally reviewed following labs and imaging studies  CBC: Recent Labs  Lab 01/03/18 0108  WBC 12.3*  HGB 10.7*  HCT 33.9*  MCV 86.0  PLT 369   Basic Metabolic Panel: Recent Labs  Lab 01/03/18 0108 01/04/18 0543  NA 139 139  K 3.5 3.1*  CL 107 102  CO2 22 26  GLUCOSE 160* 126*  BUN 9 13  CREATININE 0.86 0.96  CALCIUM 8.6* 8.3*   GFR: Estimated Creatinine Clearance: 59.3 mL/min (by C-G formula based on SCr of 0.96 mg/dL). Liver Function Tests: No results for input(s): AST, ALT, ALKPHOS, BILITOT, PROT, ALBUMIN in the last 168 hours. No results for input(s): LIPASE, AMYLASE in the last 168 hours. No results for input(s): AMMONIA in the last 168 hours. Coagulation Profile: No results for input(s): INR, PROTIME in the last 168 hours. Cardiac Enzymes: Recent Labs  Lab  01/03/18 1702 01/03/18 1934 01/04/18 0543  TROPONINI 0.51* 0.50* 0.31*   BNP (last 3 results) No results for input(s): PROBNP in the last 8760 hours. HbA1C: Recent Labs    01/03/18 2134  HGBA1C 5.8*   CBG: Recent Labs  Lab 01/03/18 1704 01/03/18 2318 01/04/18 0633 01/04/18 1126  GLUCAP 112* 171* 141* 136*   Lipid Profile: No results for input(s): CHOL, HDL, LDLCALC, TRIG, CHOLHDL, LDLDIRECT in the last 72 hours. Thyroid Function  Tests: No results for input(s): TSH, T4TOTAL, FREET4, T3FREE, THYROIDAB in the last 72 hours. Anemia Panel: No results for input(s): VITAMINB12, FOLATE, FERRITIN, TIBC, IRON, RETICCTPCT in the last 72 hours. Sepsis Labs: No results for input(s): PROCALCITON, LATICACIDVEN in the last 168 hours.  Recent Results (from the past 240 hour(s))  Respiratory Panel by PCR     Status: None   Collection Time: 01/04/18  4:53 AM  Result Value Ref Range Status   Adenovirus NOT DETECTED NOT DETECTED Final   Coronavirus 229E NOT DETECTED NOT DETECTED Final   Coronavirus HKU1 NOT DETECTED NOT DETECTED Final   Coronavirus NL63 NOT DETECTED NOT DETECTED Final   Coronavirus OC43 NOT DETECTED NOT DETECTED Final   Metapneumovirus NOT DETECTED NOT DETECTED Final   Rhinovirus / Enterovirus NOT DETECTED NOT DETECTED Final   Influenza A NOT DETECTED NOT DETECTED Final   Influenza B NOT DETECTED NOT DETECTED Final   Parainfluenza Virus 1 NOT DETECTED NOT DETECTED Final   Parainfluenza Virus 2 NOT DETECTED NOT DETECTED Final   Parainfluenza Virus 3 NOT DETECTED NOT DETECTED Final   Parainfluenza Virus 4 NOT DETECTED NOT DETECTED Final   Respiratory Syncytial Virus NOT DETECTED NOT DETECTED Final   Bordetella pertussis NOT DETECTED NOT DETECTED Final   Chlamydophila pneumoniae NOT DETECTED NOT DETECTED Final   Mycoplasma pneumoniae NOT DETECTED NOT DETECTED Final         Radiology Studies: Dg Chest 2 View  Result Date: 01/03/2018 CLINICAL DATA:  Chest pain and shortness of breath tonight. EXAM: CHEST  2 VIEW COMPARISON:  None. FINDINGS: Mild cardiomegaly with normal mediastinal contours. Interstitial opacities suspicious for pulmonary edema. Small bilateral pleural effusions and fluid in the fissures. No confluent airspace disease. No pneumothorax. Right shoulder periarticular calcifications. There is degenerative change in the spine. IMPRESSION: Interstitial opacities suspicious for pulmonary edema with  bilateral pleural effusions consistent with CHF. Electronically Signed   By: Rubye OaksMelanie  Ehinger M.D.   On: 01/03/2018 01:55        Scheduled Meds: . aspirin EC  81 mg Oral Daily  . atorvastatin  80 mg Oral q1800  . carvedilol  12.5 mg Oral BID  . darifenacin  7.5 mg Oral Daily  . enoxaparin (LOVENOX) injection  40 mg Subcutaneous Q24H  . furosemide  40 mg Intravenous BID  . insulin aspart  0-5 Units Subcutaneous QHS  . insulin aspart  0-9 Units Subcutaneous TID WC  . insulin glargine  20 Units Subcutaneous QHS  . [START ON 01/05/2018] pneumococcal 23 valent vaccine  0.5 mL Intramuscular Tomorrow-1000  . potassium chloride  40 mEq Oral BID  . rOPINIRole  2 mg Oral QHS  . sacubitril-valsartan  1 tablet Oral BID  . sertraline  100 mg Oral Daily  . sodium chloride flush  3 mL Intravenous Q12H  . ticagrelor  90 mg Oral BID   Continuous Infusions: . sodium chloride       LOS: 1 day    Dron Jaynie CollinsPrasad Bhandari, MD [facility] Pager [phone]  If 7PM-7AM, please contact night-coverage [email or web address] Password [email or web address] 01/04/2018, 1:00 PM

## 2018-01-04 NOTE — Progress Notes (Signed)
  Echocardiogram 2D Echocardiogram has been performed.  Christine Levy L Androw 01/04/2018, 2:51 PM

## 2018-01-04 NOTE — Progress Notes (Signed)
Received pt to 4E06 from [facility]. Tele box placed, verified x2. VSS. Respiratory panel obtained. Pt oriented to room and call light. Will continue to monitor.  Margarito LinerStephanie M Ahnika Hannibal, RN

## 2018-01-04 NOTE — Consult Note (Signed)
Reason for Consult:Dyspnea and CHF Referring Physician: Lawson Radar, MD  Christine Levy is an 72 y.o. female.  HPI: She has h/o known CAD and ischemic cardiomyopathy and uncontrolled DM which has recently been controlled, hypertension and morbid obesity admitted with dyspnea and also wheezing and leg edema. Symptoms started with cough and malaise 3-4 days ago and productive sputum. Then she noticed leg edema and worsening dyspnea. No CP or palpitations and presented to the hospital and found to be in florid CHF and admitted for further evaluation. Negative study for pneumonia or influenza. BNP elevated.   Past Medical History:  Diagnosis Date  . Acute respiratory failure ([place])   . Cardiomyopathy ([place])   . Hypertension   . MI (myocardial infarction) ([place])   . Systolic heart failure [facility])     Past Surgical History:  Procedure Laterality Date  . ABDOMINAL AORTOGRAM N/A 02/21/2017   Procedure: Abdominal Aortogram;  Surgeon: Adrian Prows, MD;  Location: [place] [facility];  Service: Cardiovascular;  Laterality: N/A;  . LOWER EXTREMITY ANGIOGRAPHY N/A 02/21/2017   Procedure: Lower Extremity Angiography;  Surgeon: Adrian Prows, MD;  Location: [place] [facility];  Service: Cardiovascular;  Laterality: N/A;  . PERIPHERAL VASCULAR INTERVENTION  02/21/2017   Procedure: Peripheral Vascular Intervention;  Surgeon: Adrian Prows, MD;  Location: [place] [facility];  Service: Cardiovascular;;    History reviewed. No pertinent family history.  Social History:  reports that she has quit smoking. she has never used smokeless tobacco. Her alcohol and drug histories are not on file.  Allergies:  Allergies  Allergen Reactions  . Penicillins Other (See Comments)    Has patient had a PCN reaction causing immediate rash, facial/tongue/throat swelling, SOB or lightheadedness with hypotension: unknown  Has patient had a PCN reaction causing severe rash involving mucus membranes or skin necrosis: unknown Has patient had a  PCN reaction that required hospitalization {unknown  Has patient had a PCN reaction occurring within the last 10 years: No If all of the above answers are "NO", then may proceed with Cephalosporin use.   Marland Kitchen Percodan [Oxycodone-Aspirin] Other (See Comments)    Altered mental status  . Sulfa Antibiotics Other (See Comments)    unknown  . Epinephrine Other (See Comments) and Palpitations    Tachycardia Heart palpations    Medications:  Prior to Admission:  Medications Prior to Admission  Medication Sig Dispense Refill Last Dose  . aspirin EC 81 MG tablet Take 81 mg by mouth daily.   01/02/2018 at Unknown time  . atorvastatin (LIPITOR) 80 MG tablet Take 80 mg by mouth daily at 6 PM.   01/02/2018 at Unknown time  . carvedilol (COREG) 12.5 MG tablet Take 12.5 mg by mouth 2 (two) times daily.   01/02/2018 at 8a  . Dulaglutide (TRULICITY) 1.5 PY/1.9JK SOPN Inject 1.5 mg into the skin every 7 (seven) days.   Past Week at Unknown time  . furosemide (LASIX) 40 MG tablet Take 40 mg by mouth daily as needed for fluid or edema (1-2 tablets depends how much fluid).    Past Week at Unknown time  . insulin glargine (LANTUS) 100 UNIT/ML injection Inject 30 Units into the skin 2 (two) times daily.   Past Week at Unknown time  . rOPINIRole (REQUIP) 2 MG tablet Take 2 mg by mouth at bedtime.   01/02/2018 at Unknown time  . sacubitril-valsartan (ENTRESTO) 24-26 MG Take 1 tablet by mouth 2 (two) times daily.   01/02/2018 at Unknown time  .  sertraline (ZOLOFT) 100 MG tablet Take 100 mg by mouth daily.   01/02/2018 at Unknown time  . solifenacin (VESICARE) 5 MG tablet Take 5 mg by mouth every evening.   01/02/2018 at Unknown time  . ticagrelor (BRILINTA) 90 MG TABS tablet Take 90 mg by mouth 2 (two) times daily.    01/02/2018 at AM  . nitroGLYCERIN (NITROSTAT) 0.4 MG SL tablet Place 0.4 mg under the tongue every 5 (five) minutes as needed for chest pain.   unk    Results for orders placed or performed during the hospital  encounter of 01/03/18 (from the past 48 hour(s))  Basic metabolic panel     Status: Abnormal   Collection Time: 01/03/18  1:08 AM  Result Value Ref Range   Sodium 139 135 - 145 mmol/L   Potassium 3.5 3.5 - 5.1 mmol/L   Chloride 107 101 - 111 mmol/L   CO2 22 22 - 32 mmol/L   Glucose, Bld 160 (H) 65 - 99 mg/dL   BUN 9 6 - 20 mg/dL   Creatinine, Ser 0.86 0.44 - 1.00 mg/dL   Calcium 8.6 (L) 8.9 - 10.3 mg/dL   GFR calc non Af Amer >60 >60 mL/min   GFR calc Af Amer >60 >60 mL/min    Comment: (NOTE) The eGFR has been calculated using the CKD EPI equation. This calculation has not been validated in all clinical situations. eGFR's persistently <60 mL/min signify possible Chronic Kidney Disease.    Anion gap 10 5 - 15  CBC     Status: Abnormal   Collection Time: 01/03/18  1:08 AM  Result Value Ref Range   WBC 12.3 (H) 4.0 - 10.5 K/uL   RBC 3.94 3.87 - 5.11 MIL/uL   Hemoglobin 10.7 (L) 12.0 - 15.0 g/dL   HCT 33.9 (L) 36.0 - 46.0 %   MCV 86.0 78.0 - 100.0 fL   MCH 27.2 26.0 - 34.0 pg   MCHC 31.6 30.0 - 36.0 g/dL   RDW 13.9 11.5 - 15.5 %   Platelets 369 150 - 400 K/uL  I-stat troponin, ED     Status: None   Collection Time: 01/03/18  1:39 AM  Result Value Ref Range   Troponin i, poc 0.03 0.00 - 0.08 ng/mL   Comment 3            Comment: Due to the release kinetics of cTnI, a negative result within the first hours of the onset of symptoms does not rule out myocardial infarction with certainty. If myocardial infarction is still suspected, repeat the test at appropriate intervals.   Brain natriuretic peptide     Status: Abnormal   Collection Time: 01/03/18  1:13 PM  Result Value Ref Range   B Natriuretic Peptide 717.7 (H) 0.0 - 100.0 pg/mL  Influenza panel by PCR (type A & B)     Status: None   Collection Time: 01/03/18  3:21 PM  Result Value Ref Range   Influenza A By PCR NEGATIVE NEGATIVE   Influenza B By PCR NEGATIVE NEGATIVE    Comment: (NOTE) The Xpert Xpress Flu assay is  intended as an aid in the diagnosis of  influenza and should not be used as a sole basis for treatment.  This  assay is FDA approved for nasopharyngeal swab specimens only. Nasal  washings and aspirates are unacceptable for Xpert Xpress Flu testing.   Troponin I     Status: Abnormal   Collection Time: 01/03/18  5:02 PM  Result  Value Ref Range   Troponin I 0.51 (HH) <0.03 ng/mL    Comment: CRITICAL RESULT CALLED TO, READ BACK BY AND VERIFIED WITH: W CHILDRESS,RN [id number] 01/03/2018 WBOND   CBG monitoring, ED     Status: Abnormal   Collection Time: 01/03/18  5:04 PM  Result Value Ref Range   Glucose-Capillary 112 (H) 65 - 99 mg/dL  Troponin I (q 6hr x 3)     Status: Abnormal   Collection Time: 01/03/18  7:34 PM  Result Value Ref Range   Troponin I 0.50 (HH) <0.03 ng/mL    Comment: CRITICAL VALUE NOTED.  VALUE IS CONSISTENT WITH PREVIOUSLY REPORTED AND CALLED VALUE.  Hemoglobin A1c     Status: Abnormal   Collection Time: 01/03/18  9:34 PM  Result Value Ref Range   Hgb A1c MFr Bld 5.8 (H) 4.8 - 5.6 %    Comment: (NOTE) Pre diabetes:          5.7%-6.4% Diabetes:              >6.4% Glycemic control for   <7.0% adults with diabetes    Mean Plasma Glucose 119.76 mg/dL  CBG monitoring, ED     Status: Abnormal   Collection Time: 01/03/18 11:18 PM  Result Value Ref Range   Glucose-Capillary 171 (H) 65 - 99 mg/dL  Respiratory Panel by PCR     Status: None   Collection Time: 01/04/18  4:53 AM  Result Value Ref Range   Adenovirus NOT DETECTED NOT DETECTED   Coronavirus 229E NOT DETECTED NOT DETECTED   Coronavirus HKU1 NOT DETECTED NOT DETECTED   Coronavirus NL63 NOT DETECTED NOT DETECTED   Coronavirus OC43 NOT DETECTED NOT DETECTED   Metapneumovirus NOT DETECTED NOT DETECTED   Rhinovirus / Enterovirus NOT DETECTED NOT DETECTED   Influenza A NOT DETECTED NOT DETECTED   Influenza B NOT DETECTED NOT DETECTED   Parainfluenza Virus 1 NOT DETECTED NOT DETECTED   Parainfluenza Virus 2 NOT  DETECTED NOT DETECTED   Parainfluenza Virus 3 NOT DETECTED NOT DETECTED   Parainfluenza Virus 4 NOT DETECTED NOT DETECTED   Respiratory Syncytial Virus NOT DETECTED NOT DETECTED   Bordetella pertussis NOT DETECTED NOT DETECTED   Chlamydophila pneumoniae NOT DETECTED NOT DETECTED   Mycoplasma pneumoniae NOT DETECTED NOT DETECTED  Basic metabolic panel     Status: Abnormal   Collection Time: 01/04/18  5:43 AM  Result Value Ref Range   Sodium 139 135 - 145 mmol/L   Potassium 3.1 (L) 3.5 - 5.1 mmol/L   Chloride 102 101 - 111 mmol/L   CO2 26 22 - 32 mmol/L   Glucose, Bld 126 (H) 65 - 99 mg/dL   BUN 13 6 - 20 mg/dL   Creatinine, Ser 0.96 0.44 - 1.00 mg/dL   Calcium 8.3 (L) 8.9 - 10.3 mg/dL   GFR calc non Af Amer 58 (L) >60 mL/min   GFR calc Af Amer >60 >60 mL/min    Comment: (NOTE) The eGFR has been calculated using the CKD EPI equation. This calculation has not been validated in all clinical situations. eGFR's persistently <60 mL/min signify possible Chronic Kidney Disease.    Anion gap 11 5 - 15  Troponin I (q 6hr x 3)     Status: Abnormal   Collection Time: 01/04/18  5:43 AM  Result Value Ref Range   Troponin I 0.31 (HH) <0.03 ng/mL    Comment: CRITICAL VALUE NOTED.  VALUE IS CONSISTENT WITH PREVIOUSLY REPORTED AND CALLED VALUE.  Glucose, capillary     Status: Abnormal   Collection Time: 01/04/18  6:33 AM  Result Value Ref Range   Glucose-Capillary 141 (H) 65 - 99 mg/dL   Comment 1 Notify RN    Comment 2 Document in Chart   Glucose, capillary     Status: Abnormal   Collection Time: 01/04/18 11:26 AM  Result Value Ref Range   Glucose-Capillary 136 (H) 65 - 99 mg/dL   Comment 1 Notify RN    Comment 2 Document in Chart   Troponin I (q 6hr x 3)     Status: Abnormal   Collection Time: 01/04/18  1:05 PM  Result Value Ref Range   Troponin I 0.22 (HH) <0.03 ng/mL    Comment: CRITICAL VALUE NOTED.  VALUE IS CONSISTENT WITH PREVIOUSLY REPORTED AND CALLED VALUE.  Magnesium      Status: None   Collection Time: 01/04/18  1:05 PM  Result Value Ref Range   Magnesium 1.8 1.7 - 2.4 mg/dL  Glucose, capillary     Status: Abnormal   Collection Time: 01/04/18  5:07 PM  Result Value Ref Range   Glucose-Capillary 177 (H) 65 - 99 mg/dL   Comment 1 Notify RN    Comment 2 Document in Chart     Dg Chest 2 View  Result Date: 01/03/2018 CLINICAL DATA:  Chest pain and shortness of breath tonight. EXAM: CHEST  2 VIEW COMPARISON:  None. FINDINGS: Mild cardiomegaly with normal mediastinal contours. Interstitial opacities suspicious for pulmonary edema. Small bilateral pleural effusions and fluid in the fissures. No confluent airspace disease. No pneumothorax. Right shoulder periarticular calcifications. There is degenerative change in the spine. IMPRESSION: Interstitial opacities suspicious for pulmonary edema with bilateral pleural effusions consistent with CHF. Electronically Signed   By: Jeb Levering M.D.   On: 01/03/2018 01:55    Review of Systems  Constitutional: Positive for fever. Negative for chills and weight loss.  HENT: Negative.   Eyes: Negative.   Respiratory: Positive for cough, sputum production, shortness of breath and wheezing. Negative for hemoptysis.   Cardiovascular: Positive for orthopnea and leg swelling. Negative for chest pain, palpitations, claudication and PND.  Gastrointestinal: Negative.   Genitourinary: Negative.   Musculoskeletal: Negative.   Skin: Negative.   Endo/Heme/Allergies: Negative.   All other systems reviewed and are negative.  Blood pressure (!) 121/49, pulse 75, temperature 98.6 F (37 C), temperature source Oral, resp. rate 20, height '5\' 1"'  (1.549 m), weight 103.1 kg (227 lb 4.7 oz), SpO2 99 %. Body mass index is 42.95 kg/m.  Physical Exam  Constitutional: She is oriented to person, place, and time. She appears well-developed. No distress.  Morbidly obese  HENT:  Head: Atraumatic.  Eyes: Conjunctivae are normal.  Neck: Normal  range of motion. No JVD present. No tracheal deviation present. No thyromegaly present.  Cardiovascular: Normal rate and regular rhythm.  Murmur (2/6 SEM in aortic area and apex. No gallor or rub) heard. Respiratory: She is in respiratory distress (mild). She has wheezes. She has no rales. She exhibits no tenderness.  GI: Soft. Bowel sounds are normal.  Pannus present  Musculoskeletal: Normal range of motion.  Neurological: She is alert and oriented to person, place, and time.  Skin: Skin is warm and dry. She is not diaphoretic.  Psychiatric: She has a normal mood and affect.   EKG: NSR, LBBB.  Echo 01/04/2018: Normal LVEF. Grade II diastolic dysfunction, moderate pulmonary hypertension  Assessment/Plan: 1.  Shortness of breath and hypoxemia probably related  to acute on chronic diastolic heart failure from upper respiratory infection.  Elevated serum troponin probably secondary.  Patient does have diffuse coronary artery disease. 2.  Coronary artery disease Coronary angiogram performed in [place]: 09/29/2016: Severe diffuse triple-vessel coronary disease, small calibered vessels.  Distal circumflex 80%, distal PL branch 80%, LAD 90% stenosis, successful angioplasty with placement of 2.25 x 12, 2.25 x 23, 2.5 x 33 and 2.5 x 8 overlapping  Xience DES.  Mid to distal anteroapical and anterior and inferoapical dyskinesis, LVEF 25%. 3.  History of ischemic cardiomyopathy with severe LV systolic dysfunction which is now resolved. 4.  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  Hyperlipidemia 6.  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 7. Morbid obesity.   Rec:  Patient presently doing well and improved with present therapy. Continue IV diuresis and may be able to be discharged tomorrow morning on  Lasix 40 mg daily and I will  F/u in 10 days. Continue Entresto and Coreg for now. DM is now well controlled and renal function is stable.   Adrian Prows 01/04/2018, 6:40 PM

## 2018-01-05 DIAGNOSIS — I10 Essential (primary) hypertension: Secondary | ICD-10-CM

## 2018-01-05 DIAGNOSIS — I5033 Acute on chronic diastolic (congestive) heart failure: Secondary | ICD-10-CM

## 2018-01-05 LAB — BASIC METABOLIC PANEL
Anion gap: 11 (ref 5–15)
BUN: 22 mg/dL — AB (ref 6–20)
CHLORIDE: 99 mmol/L — AB (ref 101–111)
CO2: 27 mmol/L (ref 22–32)
CREATININE: 1.19 mg/dL — AB (ref 0.44–1.00)
Calcium: 8.7 mg/dL — ABNORMAL LOW (ref 8.9–10.3)
GFR calc Af Amer: 52 mL/min — ABNORMAL LOW (ref >60)
GFR calc non Af Amer: 45 mL/min — ABNORMAL LOW (ref >60)
GLUCOSE: 217 mg/dL — AB (ref 65–99)
Potassium: 3.6 mmol/L (ref 3.5–5.1)
Sodium: 137 mmol/L (ref 135–145)

## 2018-01-05 LAB — GLUCOSE, CAPILLARY
GLUCOSE-CAPILLARY: 121 mg/dL — AB (ref 65–99)
Glucose-Capillary: 152 mg/dL — ABNORMAL HIGH (ref 65–99)

## 2018-01-05 MED ORDER — CHLORTHALIDONE 25 MG PO TABS: 25.0000 mg | ORAL_TABLET | Freq: Every morning | ORAL | 0 refills | Status: DC

## 2018-01-05 MED ORDER — CHLORTHALIDONE 25 MG PO TABS
25.0000 mg | ORAL_TABLET | Freq: Every morning | ORAL | Status: DC
Start: 2018-01-06 — End: 2018-01-05

## 2018-01-05 MED ORDER — POTASSIUM CHLORIDE CRYS ER 20 MEQ PO TBCR: 20.0000 meq | EXTENDED_RELEASE_TABLET | Freq: Every day | ORAL | 0 refills | Status: DC

## 2018-01-05 NOTE — Discharge Summary (Addendum)
Physician Discharge Summary  Christine Levy RUE:[id number] DOB: [birth year] DOA: 01/03/2018  PCP: Pearson Grippe, MD  Admit date: 01/03/2018 Discharge date: 01/05/2018  Admitted From:home Disposition:home  Recommendations for Outpatient Follow-up:  1. Follow up with PCP in 1-2 weeks 2. Please obtain BMP/CBC in one week  Home Health:no Equipment/Devices:none Discharge Condition:stable CODE STATUS:full code Diet recommendation:low salt/carb modified diet.  Brief/Interim Summary: 72 y.o.femalewith a Past Medical History of CHF (EF roughly 20% in the past but more recently 45-50% in 5/18); CAD; and HTN who presents with SOB.  Also with URI like symptoms for 2- 3 days.  She was found to be hypoxic to 88% ambulatory.  #Acute on chronic diastolic congestive heart failure. -Chest x-ray with acute pulmonary edema,, elevated troponin on admission.  Patient was treated with IV Lasix with clinical improvement.  Echo with EF of 55-60% and grade 2 diastolic dysfunction.  Seen by cardiologist.  Recommended chlorthalidone on discharge.  Patient was able to lie flat without any shortness of breath.  Hypoxia resolved.  Educated on low-salt diet, daily weight and follow-up with cardiologist.  She verbalized understanding.  Discharge with oral potassium chloride while on diuretics.    #Acute respiratory failure with hypoxia in the setting of CHF.  Improving.  On room air. -Respiratory viral study negative.  #Hypokalemia: Repleted potassium chloride.  Discharge with oral potassium chloride.  Recommend to monitor labs.  Magnesium level acceptable.  #Coronary artery disease/hyperlipidemia: Continue home medication.  :  Stable. On aspirin, Coreg, Lipitor  #Hypertension: Blood pressure improved.  #anxiety depression: Continue Zoloft. OSA on CPAP.  Continue  Patient is clinically improved.  Stable on discharge.    Discharge Diagnoses:  Principal Problem:   Acute respiratory failure (HCC) Active  Problems:   Peripheral arterial disease (HCC)   Coronary artery disease involving native coronary artery of native heart without angina pectoris   Ischemic cardiomyopathy   Acute on chronic systolic congestive heart failure (HCC)   Essential hypertension   Mixed hyperlipidemia   Severe obesity (BMI 35.0-39.9) with comorbidity (HCC)   DM type 2 with diabetic peripheral neuropathy (HCC)   Pleural effusion    Discharge Instructions  Discharge Instructions    (HEART FAILURE PATIENTS) Call MD:  Anytime you have any of the following symptoms: 1) 3 pound weight gain in 24 hours or 5 pounds in 1 week 2) shortness of breath, with or without a dry hacking cough 3) swelling in the hands, feet or stomach 4) if you have to sleep on extra pillows at night in order to breathe.   Complete by:  As directed    Call MD for:  difficulty breathing, headache or visual disturbances   Complete by:  As directed    Call MD for:  extreme fatigue   Complete by:  As directed    Call MD for:  hives   Complete by:  As directed    Call MD for:  persistant dizziness or light-headedness   Complete by:  As directed    Call MD for:  persistant nausea and vomiting   Complete by:  As directed    Call MD for:  severe uncontrolled pain   Complete by:  As directed    Call MD for:  temperature >100.4   Complete by:  As directed    Diet - low sodium heart healthy   Complete by:  As directed    Diet Carb Modified   Complete by:  As directed    Increase activity slowly  Complete by:  As directed      Allergies as of 01/05/2018      Reactions   Penicillins Other (See Comments)   Has patient had a PCN reaction causing immediate rash, facial/tongue/throat swelling, SOB or lightheadedness with hypotension: unknown  Has patient had a PCN reaction causing severe rash involving mucus membranes or skin necrosis: unknown Has patient had a PCN reaction that required hospitalization {unknown  Has patient had a PCN reaction  occurring within the last 10 years: No If all of the above answers are "NO", then may proceed with Cephalosporin use.   Percodan [oxycodone-aspirin] Other (See Comments)   Altered mental status   Sulfa Antibiotics Other (See Comments)   unknown   Epinephrine Other (See Comments), Palpitations   Tachycardia Heart palpations      Medication List    STOP taking these medications   furosemide 40 MG tablet Commonly known as:  LASIX     TAKE these medications   aspirin EC 81 MG tablet Take 81 mg by mouth daily.   atorvastatin 80 MG tablet Commonly known as:  LIPITOR Take 80 mg by mouth daily at 6 PM.   carvedilol 12.5 MG tablet Commonly known as:  COREG Take 12.5 mg by mouth 2 (two) times daily.   chlorthalidone 25 MG tablet Commonly known as:  HYGROTON Take 1 tablet (25 mg total) by mouth every morning. Start taking on:  01/06/2018   insulin glargine 100 UNIT/ML injection Commonly known as:  LANTUS Inject 30 Units into the skin 2 (two) times daily.   nitroGLYCERIN 0.4 MG SL tablet Commonly known as:  NITROSTAT Place 0.4 mg under the tongue every 5 (five) minutes as needed for chest pain.   potassium chloride SA 20 MEQ tablet Commonly known as:  K-DUR,KLOR-CON Take 1 tablet (20 mEq total) by mouth daily.   rOPINIRole 2 MG tablet Commonly known as:  REQUIP Take 2 mg by mouth at bedtime.   sacubitril-valsartan 24-26 MG Commonly known as:  ENTRESTO Take 1 tablet by mouth 2 (two) times daily.   sertraline 100 MG tablet Commonly known as:  ZOLOFT Take 100 mg by mouth daily.   solifenacin 5 MG tablet Commonly known as:  VESICARE Take 5 mg by mouth every evening.   ticagrelor 90 MG Tabs tablet Commonly known as:  BRILINTA Take 90 mg by mouth 2 (two) times daily.   TRULICITY 1.5 MG/0.5ML Sopn Generic drug:  Dulaglutide Inject 1.5 mg into the skin every 7 (seven) days.      Follow-up Information    Yates DecampGanji, Jay, MD Follow up on 01/12/2018.   Specialty:   Cardiology Why:  Please arrive at 12:15 pm. Bring all medications. Get blood work at our office on Wednesday on 16th. Contact information: [address] [place] [address] [phone]        Pearson GrippeKim, James, MD. Schedule an appointment as soon as possible for a visit in 1 week(s).   Specialty:  Internal Medicine Contact information: [address] [place] [address] [phone]          Allergies  Allergen Reactions  . Penicillins Other (See Comments)    Has patient had a PCN reaction causing immediate rash, facial/tongue/throat swelling, SOB or lightheadedness with hypotension: unknown  Has patient had a PCN reaction causing severe rash involving mucus membranes or skin necrosis: unknown Has patient had a PCN reaction that required hospitalization {unknown  Has patient had a PCN reaction occurring within  the last 10 years: No If all of the above answers are "NO", then may proceed with Cephalosporin use.   Marland Kitchen Percodan [Oxycodone-Aspirin] Other (See Comments)    Altered mental status  . Sulfa Antibiotics Other (See Comments)    unknown  . Epinephrine Other (See Comments) and Palpitations    Tachycardia Heart palpations    Consultations: Cardiology  Procedures/Studies: Echo  Subjective: Seen and examined at bedside.  Reported doing well.  Denies headache, dizziness, nausea vomiting chest pain shortness of breath.  Discharge Exam: Vitals:   01/05/18 0915 01/05/18 1000  BP: 114/65   Pulse:    Resp: 17 (!) 23  Temp:    SpO2:     Vitals:   01/05/18 0800 01/05/18 0900 01/05/18 0915 01/05/18 1000  BP:   114/65   Pulse:      Resp: 20 15 17  (!) 23  Temp:      TempSrc:      SpO2:      Weight:      Height:        General: Pt is alert, awake, not in acute distress, lying flat in bed without any discomfort. Cardiovascular: RRR, S1/S2 +, no rubs, no gallops Respiratory: CTA bilaterally, no wheezing, no rhonchi Abdominal: Soft, NT,  ND, bowel sounds + Extremities: no edema, no cyanosis    The results of significant diagnostics from this hospitalization (including imaging, microbiology, ancillary and laboratory) are listed below for reference.     Microbiology: Recent Results (from the past 240 hour(s))  Respiratory Panel by PCR     Status: None   Collection Time: 01/04/18  4:53 AM  Result Value Ref Range Status   Adenovirus NOT DETECTED NOT DETECTED Final   Coronavirus 229E NOT DETECTED NOT DETECTED Final   Coronavirus HKU1 NOT DETECTED NOT DETECTED Final   Coronavirus NL63 NOT DETECTED NOT DETECTED Final   Coronavirus OC43 NOT DETECTED NOT DETECTED Final   Metapneumovirus NOT DETECTED NOT DETECTED Final   Rhinovirus / Enterovirus NOT DETECTED NOT DETECTED Final   Influenza A NOT DETECTED NOT DETECTED Final   Influenza B NOT DETECTED NOT DETECTED Final   Parainfluenza Virus 1 NOT DETECTED NOT DETECTED Final   Parainfluenza Virus 2 NOT DETECTED NOT DETECTED Final   Parainfluenza Virus 3 NOT DETECTED NOT DETECTED Final   Parainfluenza Virus 4 NOT DETECTED NOT DETECTED Final   Respiratory Syncytial Virus NOT DETECTED NOT DETECTED Final   Bordetella pertussis NOT DETECTED NOT DETECTED Final   Chlamydophila pneumoniae NOT DETECTED NOT DETECTED Final   Mycoplasma pneumoniae NOT DETECTED NOT DETECTED Final     Labs: BNP (last 3 results) Recent Labs    01/03/18 1313  BNP 717.7*   Basic Metabolic Panel: Recent Labs  Lab 01/03/18 0108 01/04/18 0543 01/04/18 1305 01/05/18 0149  NA 139 139  --  137  K 3.5 3.1*  --  3.6  CL 107 102  --  99*  CO2 22 26  --  27  GLUCOSE 160* 126*  --  217*  BUN 9 13  --  22*  CREATININE 0.86 0.96  --  1.19*  CALCIUM 8.6* 8.3*  --  8.7*  MG  --   --  1.8  --    Liver Function Tests: No results for input(s): AST, ALT, ALKPHOS, BILITOT, PROT, ALBUMIN in the last 168 hours. No results for input(s): LIPASE, AMYLASE in the last 168 hours. No results for input(s): AMMONIA in  the last 168 hours. CBC: Recent  Labs  Lab 01/03/18 0108  WBC 12.3*  HGB 10.7*  HCT 33.9*  MCV 86.0  PLT 369   Cardiac Enzymes: Recent Labs  Lab 01/03/18 1702 01/03/18 1934 01/04/18 0543 01/04/18 1305  TROPONINI 0.51* 0.50* 0.31* 0.22*   BNP: Invalid input(s): POCBNP CBG: Recent Labs  Lab 01/04/18 0633 01/04/18 1126 01/04/18 1707 01/04/18 2051 01/05/18 0615  GLUCAP 141* 136* 177* 153* 152*   D-Dimer No results for input(s): DDIMER in the last 72 hours. Hgb A1c Recent Labs    01/03/18 2134  HGBA1C 5.8*   Lipid Profile No results for input(s): CHOL, HDL, LDLCALC, TRIG, CHOLHDL, LDLDIRECT in the last 72 hours. Thyroid function studies No results for input(s): TSH, T4TOTAL, T3FREE, THYROIDAB in the last 72 hours.  Invalid input(s): FREET3 Anemia work up No results for input(s): VITAMINB12, FOLATE, FERRITIN, TIBC, IRON, RETICCTPCT in the last 72 hours. Urinalysis No results found for: COLORURINE, APPEARANCEUR, LABSPEC, PHURINE, GLUCOSEU, HGBUR, BILIRUBINUR, KETONESUR, PROTEINUR, UROBILINOGEN, NITRITE, LEUKOCYTESUR Sepsis Labs Invalid input(s): PROCALCITONIN,  WBC,  LACTICIDVEN Microbiology Recent Results (from the past 240 hour(s))  Respiratory Panel by PCR     Status: None   Collection Time: 01/04/18  4:53 AM  Result Value Ref Range Status   Adenovirus NOT DETECTED NOT DETECTED Final   Coronavirus 229E NOT DETECTED NOT DETECTED Final   Coronavirus HKU1 NOT DETECTED NOT DETECTED Final   Coronavirus NL63 NOT DETECTED NOT DETECTED Final   Coronavirus OC43 NOT DETECTED NOT DETECTED Final   Metapneumovirus NOT DETECTED NOT DETECTED Final   Rhinovirus / Enterovirus NOT DETECTED NOT DETECTED Final   Influenza A NOT DETECTED NOT DETECTED Final   Influenza B NOT DETECTED NOT DETECTED Final   Parainfluenza Virus 1 NOT DETECTED NOT DETECTED Final   Parainfluenza Virus 2 NOT DETECTED NOT DETECTED Final   Parainfluenza Virus 3 NOT DETECTED NOT DETECTED Final    Parainfluenza Virus 4 NOT DETECTED NOT DETECTED Final   Respiratory Syncytial Virus NOT DETECTED NOT DETECTED Final   Bordetella pertussis NOT DETECTED NOT DETECTED Final   Chlamydophila pneumoniae NOT DETECTED NOT DETECTED Final   Mycoplasma pneumoniae NOT DETECTED NOT DETECTED Final     Time coordinating discharge: 27 minutes  SIGNED:   Maxie Barb, MD  [facility] 01/05/2018, 11:04 AM  If 7PM-7AM, please contact night-coverage [email or web address] Password [email or web address]

## 2018-01-05 NOTE — Progress Notes (Signed)
Dc instructions given to pt at this time.  Pt verbalized understanding of all instructions.  No s/s of any acute distress or c/o pain.  Awaiting ride.

## 2018-01-05 NOTE — Progress Notes (Signed)
Subjective:  Patient feels much better, dyspnea has improved.  She was able to lay flat without orthopnea.  Using CPAP.  No chest pain or palpitations.  Objective:  Vital Signs in the last 24 hours: Temp:  [98.3 F (36.8 C)-98.5 F (36.9 C)] 98.3 F (36.8 C) (01/11 0430) Pulse Rate:  [70-77] 76 (01/11 0430) Resp:  [14-25] 19 (01/11 0500) BP: (99-134)/(49-64) 99/51 (01/11 0430) SpO2:  [95 %-100 %] 100 % (01/11 0430) Weight:  [100.3 kg (221 lb 1.9 oz)-103.1 kg (227 lb 4.7 oz)] 100.3 kg (221 lb 1.9 oz) (01/11 0430)  Intake/Output from previous day: 01/10 0701 - 01/11 0700 In: 660 [P.O.:660] Out: 2050 [Urine:2050] Intake/Output from this shift: Total I/O In: 240 [P.O.:240] Out: 750 [Urine:750]  Physical Exam: Blood pressure (!) 99/51, pulse 76, temperature 98.3 F (36.8 C), temperature source Oral, resp. rate 19, height 5\' 1"  (1.549 m), weight 100.3 kg (221 lb 1.9 oz), SpO2 100 %. Constitutional: She is oriented to person, place, and time. She appears well-developed. No distress.  Morbidly obese  HENT:  Head: Atraumatic.  Eyes: Conjunctivae are normal.  Neck: Normal range of motion. No JVD present. No tracheal deviation present. No thyromegaly present.  Cardiovascular: Normal rate and regular rhythm.  Murmur (2/6 SEM in aortic area and apex. No gallor or rub) heard. Respiratory: No distress. She has clear breath sounds. She has no rales. She exhibits no tenderness.  GI: Soft. Bowel sounds are normal.  Pannus present  Musculoskeletal: Normal range of motion.  Neurological: She is alert and oriented to person, place, and time.  Skin: Skin is warm and dry. She is not diaphoretic.  Psychiatric: She has a normal mood and affect.    Lab Results: Recent Labs    01/03/18 0108  WBC 12.3*  HGB 10.7*  PLT 369   Recent Labs    01/04/18 0543 01/05/18 0149  NA 139 137  K 3.1* 3.6  CL 102 99*  CO2 26 27  GLUCOSE 126* 217*  BUN 13 22*  CREATININE 0.96 1.19*   Recent Labs     01/04/18 0543 01/04/18 1305  TROPONINI 0.31* 0.22*   Cardiac Studies: EKG: NSR, LBBB.  Echo 01/04/2018: Normal LVEF. Grade II diastolic dysfunction, moderate pulmonary hypertension  Tele: Occasional PVC  Assessment/Plan:   Assessment/Plan: 1.  Shortness of breath and hypoxemia probably related to acute on chronic diastolic heart failure from upper respiratory infection.  Elevated serum troponin probably secondary.  Patient does have diffuse coronary artery disease. 2.  Coronary artery disease Coronary angiogram performed in [place]: 09/29/2016: Severe diffuse triple-vessel coronary disease, small calibered vessels.  Distal circumflex 80%, distal PL branch 80%, LAD 90% stenosis, successful angioplasty with placement of 2.25 x 12, 2.25 x 23, 2.5 x 33 and 2.5 x 8 overlapping  Xience DES.  Mid to distal anteroapical and anterior and inferoapical dyskinesis, LVEF 25%. 3.  History of ischemic cardiomyopathy with severe LV systolic dysfunction which is now resolved. 4.  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  Hyperlipidemia 6.  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 7. Morbid obesity.  8.  Peripheral arterial disease with history of stenting of the bilateral common iliac artery with implantation of 9.0 x 37 mm express LD on the right common iliac artery and 9.0 x 25 mm express LD on the left common iliac artery, stenosis reduced from 100% to 0% on the right and 50% to 0% on the left on 02/21/2017. 9.  Obstructive sleep  apnea presently on CPAP and compliant.  Recommendation: Patient is stable for discharge today.  Dyspnea has improved.  She has not had any angina pectoris.  Diabetes is well controlled and she does have mild stage II-III chronic kidney disease that appears slightly worse due to diuresis.  Would recommend discharge on chlorthalidone 25 mg every morning instead of furosemide, patient appears to be extremely sensitive to furosemide  with hypotension and has had syncope after using furosemide for 2 days continuously while she was in [place].  I will see her in 10 days in our office, will repeat BMP at that time.  Extensive discussion had with the patient regarding making lifestyle changes.     LOS: 2 days    Yates DecampJay Selby Slovacek 01/05/2018, 9:20 AM

## 2018-01-08 DIAGNOSIS — R197 Diarrhea, unspecified: Secondary | ICD-10-CM | POA: Diagnosis not present

## 2018-01-08 DIAGNOSIS — I5031 Acute diastolic (congestive) heart failure: Secondary | ICD-10-CM | POA: Diagnosis not present

## 2018-01-08 DIAGNOSIS — I10 Essential (primary) hypertension: Secondary | ICD-10-CM | POA: Diagnosis not present

## 2018-01-14 NOTE — Progress Notes (Signed)
  Subjective:  Patient ID: Christine BenedictJudy Lague, female    DOB: 1946/06/20,  MRN: [id number]  Chief Complaint  Patient presents with  . Foot Ulcer    Left, Great toe and 2nd toe f/u.    72 y.o. female returns for wound care. Believes the wounds to be improving. Denies N/V/F/Ch.  Objective:  There were no vitals filed for this visit. General AA&O x3. Normal mood and affect.  Vascular Foot warm to touch.  Neurologic Sensation grossly diminished.  Dermatologic (Wound) Wound Location: Lt. foot hallux Wound Measurement: 0.5 x 0.5 Wound Base: Granular/Healthy Peri-wound: Normal Exudate: None: wound tissue dry  Wound progress: Improved since last check.  Orthopedic: No pain to palpation either foot.   Assessment & Plan:  Patient was evaluated and treated and all questions answered.  Ulcer left great toe -Debridement as below. -Dressed with antibiotic ointment, DSD.  Procedure: Selective Debridement of Wound Rationale: Removal of devitalized tissue from the wound to promote healing.  Pre-Debridement Wound Measurements: 0.5 cm x 0.5 cm x 0.1 cm  Post-Debridement Wound Measurements: same as pre-debridement. Type of Debridement: Selective Tissue Removed: Devitalized soft-tissue Instrumentation: 3-0 mm dermal curette Dressing: Dry, sterile, compression dressing. Disposition: Patient tolerated procedure well. Patient to return in 1 week for follow-up.  No Follow-up on file.

## 2018-01-14 NOTE — Progress Notes (Signed)
  Subjective:  Patient ID: Christine Levy, female    DOB: [birth year],  MRN: [id number]  No chief complaint on file.  72 y.o. female returns for wound care. Believes the wound to be healed. Denies N/V/F/Ch.  Objective:  There were no vitals filed for this visit. General AA&O x3. Normal mood and affect.  Vascular Foot warm to touch.  Neurologic Sensation grossly diminished.  Dermatologic (Wound) Wound Location: Lt. foot hallux Wound Measurement: Epithelialized Wound Base: epithelialized Peri-wound: Normal Exudate: None: wound tissue dry  Wound progress: Improved since last check.  Orthopedic: No pain to palpation either foot.   Assessment & Plan:  Patient was evaluated and treated and all questions answered.  Ulcer left great toe -Wound appears healed.  Educated on prompt return for care should the ulcer recur.  Will have patient follow-up in 4 weeks to ensure wound remain heels prior to discharge.  Return in about 4 weeks (around 12/28/2017) for Wound check.

## 2018-01-24 DIAGNOSIS — I10 Essential (primary) hypertension: Secondary | ICD-10-CM | POA: Diagnosis not present

## 2018-01-24 DIAGNOSIS — K219 Gastro-esophageal reflux disease without esophagitis: Secondary | ICD-10-CM | POA: Diagnosis not present

## 2018-01-24 DIAGNOSIS — E118 Type 2 diabetes mellitus with unspecified complications: Secondary | ICD-10-CM | POA: Diagnosis not present

## 2018-01-24 DIAGNOSIS — R197 Diarrhea, unspecified: Secondary | ICD-10-CM | POA: Diagnosis not present

## 2018-01-25 ENCOUNTER — Encounter: Payer: Self-pay | Admitting: Podiatry

## 2018-01-25 ENCOUNTER — Ambulatory Visit (INDEPENDENT_AMBULATORY_CARE_PROVIDER_SITE_OTHER): Payer: Medicare Other | Admitting: Podiatry

## 2018-01-25 DIAGNOSIS — E1142 Type 2 diabetes mellitus with diabetic polyneuropathy: Secondary | ICD-10-CM

## 2018-01-25 DIAGNOSIS — L97521 Non-pressure chronic ulcer of other part of left foot limited to breakdown of skin: Secondary | ICD-10-CM

## 2018-01-25 DIAGNOSIS — I509 Heart failure, unspecified: Secondary | ICD-10-CM | POA: Diagnosis not present

## 2018-01-25 DIAGNOSIS — B351 Tinea unguium: Secondary | ICD-10-CM

## 2018-01-25 NOTE — Progress Notes (Signed)
  Subjective:  Patient ID: Christine BenedictJudy Ferraris, female    DOB: [birth year],  MRN: [id number]  Chief Complaint  Patient presents with  . Wound Check    Left Hallux, doing well.   . Nail Problem    Debride/Diabetic    72 y.o. female returns for healed wound care. Believes the wound to be healed.  Endorses numbness and tingling.  Request care of nails today.  Denies N/V/F/Ch.  Objective:  There were no vitals filed for this visit. General AA&O x3. Normal mood and affect.  Vascular Foot warm to touch.  Neurologic Sensation grossly diminished.  Dermatologic (Wound) Wound Location: Left hallux Wound Measurement: Healed Wound Base: Epithelialized Peri-wound: Normal Exudate: None.  Healed  Wound progress: Improved since last check.  Nails x10 elongated thickened brittle with crumbly texture dystrophy  Orthopedic: No pain to palpation either foot.   Assessment & Plan:  Patient was evaluated and treated and all questions answered.  Ulcer left hallux -Healed.  Discussed return precautions.  Diabetes with neuropathy, Onychomycosis -Educated on diabetic footcare. Diabetic risk level 1 -Nails x10 debrided sharply and manually with large nail nipper and rotary burr.  Procedure: Nail Debridement Rationale: Patient meets criteria for routine foot care due to 1 class B, 2 class C findings Type of Debridement: manual, sharp debridement. Instrumentation: Nail nipper, rotary burr. Number of Nails: 10  Follow-up in 3 months for diabetic foot care

## 2018-01-31 DIAGNOSIS — I739 Peripheral vascular disease, unspecified: Secondary | ICD-10-CM | POA: Diagnosis not present

## 2018-01-31 DIAGNOSIS — I5032 Chronic diastolic (congestive) heart failure: Secondary | ICD-10-CM | POA: Diagnosis not present

## 2018-01-31 DIAGNOSIS — I951 Orthostatic hypotension: Secondary | ICD-10-CM | POA: Diagnosis not present

## 2018-01-31 DIAGNOSIS — I25119 Atherosclerotic heart disease of native coronary artery with unspecified angina pectoris: Secondary | ICD-10-CM | POA: Diagnosis not present

## 2018-02-02 DIAGNOSIS — R197 Diarrhea, unspecified: Secondary | ICD-10-CM | POA: Diagnosis not present

## 2018-05-04 ENCOUNTER — Ambulatory Visit: Payer: Medicare Other | Admitting: Podiatry

## 2018-05-28 DIAGNOSIS — L603 Nail dystrophy: Secondary | ICD-10-CM | POA: Diagnosis not present

## 2018-05-28 DIAGNOSIS — E1042 Type 1 diabetes mellitus with diabetic polyneuropathy: Secondary | ICD-10-CM | POA: Diagnosis not present

## 2018-05-28 DIAGNOSIS — M2011 Hallux valgus (acquired), right foot: Secondary | ICD-10-CM | POA: Diagnosis not present

## 2018-05-28 DIAGNOSIS — I70203 Unspecified atherosclerosis of native arteries of extremities, bilateral legs: Secondary | ICD-10-CM | POA: Diagnosis not present

## 2018-05-28 DIAGNOSIS — M2012 Hallux valgus (acquired), left foot: Secondary | ICD-10-CM | POA: Diagnosis not present

## 2018-06-21 DIAGNOSIS — E119 Type 2 diabetes mellitus without complications: Secondary | ICD-10-CM | POA: Diagnosis not present

## 2018-06-21 DIAGNOSIS — Z961 Presence of intraocular lens: Secondary | ICD-10-CM | POA: Diagnosis not present

## 2018-06-26 DIAGNOSIS — I509 Heart failure, unspecified: Secondary | ICD-10-CM | POA: Diagnosis not present

## 2018-06-26 DIAGNOSIS — G4733 Obstructive sleep apnea (adult) (pediatric): Secondary | ICD-10-CM | POA: Diagnosis not present

## 2018-06-26 DIAGNOSIS — I2101 ST elevation (STEMI) myocardial infarction involving left main coronary artery: Secondary | ICD-10-CM | POA: Diagnosis not present

## 2018-06-26 DIAGNOSIS — R531 Weakness: Secondary | ICD-10-CM | POA: Diagnosis not present

## 2018-06-26 DIAGNOSIS — E782 Mixed hyperlipidemia: Secondary | ICD-10-CM | POA: Diagnosis not present

## 2018-06-26 DIAGNOSIS — I2510 Atherosclerotic heart disease of native coronary artery without angina pectoris: Secondary | ICD-10-CM | POA: Diagnosis not present

## 2018-06-26 DIAGNOSIS — I110 Hypertensive heart disease with heart failure: Secondary | ICD-10-CM | POA: Diagnosis not present

## 2018-06-26 DIAGNOSIS — Z79899 Other long term (current) drug therapy: Secondary | ICD-10-CM | POA: Diagnosis not present

## 2018-06-26 DIAGNOSIS — R0602 Shortness of breath: Secondary | ICD-10-CM | POA: Diagnosis not present

## 2018-06-26 DIAGNOSIS — I10 Essential (primary) hypertension: Secondary | ICD-10-CM | POA: Diagnosis not present

## 2018-06-26 DIAGNOSIS — I739 Peripheral vascular disease, unspecified: Secondary | ICD-10-CM | POA: Diagnosis not present

## 2018-06-26 DIAGNOSIS — R5383 Other fatigue: Secondary | ICD-10-CM | POA: Diagnosis not present

## 2018-06-26 DIAGNOSIS — I5021 Acute systolic (congestive) heart failure: Secondary | ICD-10-CM | POA: Diagnosis not present

## 2018-06-26 DIAGNOSIS — E1151 Type 2 diabetes mellitus with diabetic peripheral angiopathy without gangrene: Secondary | ICD-10-CM | POA: Diagnosis not present

## 2018-06-26 DIAGNOSIS — I255 Ischemic cardiomyopathy: Secondary | ICD-10-CM | POA: Diagnosis not present

## 2018-06-26 DIAGNOSIS — R453 Demoralization and apathy: Secondary | ICD-10-CM | POA: Diagnosis not present

## 2018-07-04 DIAGNOSIS — Z87891 Personal history of nicotine dependence: Secondary | ICD-10-CM | POA: Diagnosis not present

## 2018-07-04 DIAGNOSIS — R911 Solitary pulmonary nodule: Secondary | ICD-10-CM | POA: Diagnosis not present

## 2018-07-04 DIAGNOSIS — G4733 Obstructive sleep apnea (adult) (pediatric): Secondary | ICD-10-CM | POA: Diagnosis not present

## 2018-07-04 DIAGNOSIS — J449 Chronic obstructive pulmonary disease, unspecified: Secondary | ICD-10-CM | POA: Diagnosis not present

## 2018-07-04 DIAGNOSIS — R918 Other nonspecific abnormal finding of lung field: Secondary | ICD-10-CM | POA: Diagnosis not present

## 2018-07-04 DIAGNOSIS — J432 Centrilobular emphysema: Secondary | ICD-10-CM | POA: Diagnosis not present

## 2018-07-05 DIAGNOSIS — I701 Atherosclerosis of renal artery: Secondary | ICD-10-CM | POA: Diagnosis not present

## 2018-07-06 DIAGNOSIS — I447 Left bundle-branch block, unspecified: Secondary | ICD-10-CM | POA: Diagnosis not present

## 2018-07-06 DIAGNOSIS — I255 Ischemic cardiomyopathy: Secondary | ICD-10-CM | POA: Diagnosis not present

## 2018-07-06 DIAGNOSIS — I2510 Atherosclerotic heart disease of native coronary artery without angina pectoris: Secondary | ICD-10-CM | POA: Diagnosis not present

## 2018-07-06 DIAGNOSIS — I10 Essential (primary) hypertension: Secondary | ICD-10-CM | POA: Diagnosis not present

## 2018-07-19 DIAGNOSIS — I2510 Atherosclerotic heart disease of native coronary artery without angina pectoris: Secondary | ICD-10-CM | POA: Diagnosis not present

## 2018-07-19 DIAGNOSIS — I255 Ischemic cardiomyopathy: Secondary | ICD-10-CM | POA: Diagnosis not present

## 2018-07-19 DIAGNOSIS — R9439 Abnormal result of other cardiovascular function study: Secondary | ICD-10-CM | POA: Diagnosis not present

## 2018-07-19 DIAGNOSIS — I110 Hypertensive heart disease with heart failure: Secondary | ICD-10-CM | POA: Diagnosis not present

## 2018-07-19 DIAGNOSIS — I509 Heart failure, unspecified: Secondary | ICD-10-CM | POA: Diagnosis not present

## 2018-07-23 DIAGNOSIS — G4733 Obstructive sleep apnea (adult) (pediatric): Secondary | ICD-10-CM | POA: Diagnosis not present

## 2018-07-23 DIAGNOSIS — R911 Solitary pulmonary nodule: Secondary | ICD-10-CM | POA: Diagnosis not present

## 2018-07-23 DIAGNOSIS — J449 Chronic obstructive pulmonary disease, unspecified: Secondary | ICD-10-CM | POA: Diagnosis not present

## 2018-07-30 DIAGNOSIS — I5021 Acute systolic (congestive) heart failure: Secondary | ICD-10-CM | POA: Diagnosis not present

## 2018-08-01 DIAGNOSIS — R9439 Abnormal result of other cardiovascular function study: Secondary | ICD-10-CM | POA: Diagnosis not present

## 2018-08-01 DIAGNOSIS — E782 Mixed hyperlipidemia: Secondary | ICD-10-CM | POA: Diagnosis not present

## 2018-08-01 DIAGNOSIS — I447 Left bundle-branch block, unspecified: Secondary | ICD-10-CM | POA: Diagnosis not present

## 2018-08-01 DIAGNOSIS — I110 Hypertensive heart disease with heart failure: Secondary | ICD-10-CM | POA: Diagnosis not present

## 2018-08-01 DIAGNOSIS — I255 Ischemic cardiomyopathy: Secondary | ICD-10-CM | POA: Diagnosis not present

## 2018-08-01 DIAGNOSIS — I25110 Atherosclerotic heart disease of native coronary artery with unstable angina pectoris: Secondary | ICD-10-CM | POA: Diagnosis not present

## 2018-08-17 DIAGNOSIS — I447 Left bundle-branch block, unspecified: Secondary | ICD-10-CM | POA: Diagnosis not present

## 2018-08-17 DIAGNOSIS — I10 Essential (primary) hypertension: Secondary | ICD-10-CM | POA: Diagnosis not present

## 2018-08-17 DIAGNOSIS — E785 Hyperlipidemia, unspecified: Secondary | ICD-10-CM | POA: Diagnosis not present

## 2018-08-17 DIAGNOSIS — I255 Ischemic cardiomyopathy: Secondary | ICD-10-CM | POA: Diagnosis not present

## 2018-08-17 DIAGNOSIS — I2510 Atherosclerotic heart disease of native coronary artery without angina pectoris: Secondary | ICD-10-CM | POA: Diagnosis not present

## 2018-08-19 DIAGNOSIS — G4733 Obstructive sleep apnea (adult) (pediatric): Secondary | ICD-10-CM | POA: Diagnosis not present

## 2018-09-27 DIAGNOSIS — E1042 Type 1 diabetes mellitus with diabetic polyneuropathy: Secondary | ICD-10-CM | POA: Diagnosis not present

## 2018-09-27 DIAGNOSIS — L603 Nail dystrophy: Secondary | ICD-10-CM | POA: Diagnosis not present

## 2018-09-27 DIAGNOSIS — E1051 Type 1 diabetes mellitus with diabetic peripheral angiopathy without gangrene: Secondary | ICD-10-CM | POA: Diagnosis not present

## 2018-10-01 DIAGNOSIS — E1151 Type 2 diabetes mellitus with diabetic peripheral angiopathy without gangrene: Secondary | ICD-10-CM | POA: Diagnosis not present

## 2018-10-01 DIAGNOSIS — B351 Tinea unguium: Secondary | ICD-10-CM | POA: Diagnosis not present

## 2018-10-01 DIAGNOSIS — I739 Peripheral vascular disease, unspecified: Secondary | ICD-10-CM | POA: Diagnosis not present

## 2018-10-01 DIAGNOSIS — M8580 Other specified disorders of bone density and structure, unspecified site: Secondary | ICD-10-CM | POA: Diagnosis not present

## 2018-10-01 DIAGNOSIS — N3281 Overactive bladder: Secondary | ICD-10-CM | POA: Diagnosis not present

## 2018-10-01 DIAGNOSIS — Z23 Encounter for immunization: Secondary | ICD-10-CM | POA: Diagnosis not present

## 2018-10-01 DIAGNOSIS — I255 Ischemic cardiomyopathy: Secondary | ICD-10-CM | POA: Diagnosis not present

## 2018-10-01 DIAGNOSIS — E782 Mixed hyperlipidemia: Secondary | ICD-10-CM | POA: Diagnosis not present

## 2018-10-01 DIAGNOSIS — I10 Essential (primary) hypertension: Secondary | ICD-10-CM | POA: Diagnosis not present

## 2018-10-01 DIAGNOSIS — I2510 Atherosclerotic heart disease of native coronary artery without angina pectoris: Secondary | ICD-10-CM | POA: Diagnosis not present

## 2019-01-18 ENCOUNTER — Ambulatory Visit (INDEPENDENT_AMBULATORY_CARE_PROVIDER_SITE_OTHER): Payer: Medicare Other | Admitting: Podiatry

## 2019-01-18 DIAGNOSIS — E1142 Type 2 diabetes mellitus with diabetic polyneuropathy: Secondary | ICD-10-CM | POA: Diagnosis not present

## 2019-01-18 DIAGNOSIS — B351 Tinea unguium: Secondary | ICD-10-CM | POA: Diagnosis not present

## 2019-01-18 NOTE — Progress Notes (Signed)
Subjective:  Patient ID: Christine Levy, female    DOB: Apr 14, 1946,  MRN: [id number]  Chief Complaint  Patient presents with  . Diabetes    diabetic foot and nail care/pt requested to see dr. price    73 y.o. female presents  for diabetic foot care. Last AMBS was unknown. Reports numbness and tingling in their feet. Denies cramping in legs and thighs.  Review of Systems: Negative except as noted in the HPI. Denies N/V/F/Ch.  Past Medical History:  Diagnosis Date  . Acute respiratory failure (HCC)   . Cardiomyopathy (HCC)   . Hypertension   . MI (myocardial infarction) (HCC)   . Systolic heart failure (HCC)     Current Outpatient Medications:  .  aspirin EC 81 MG tablet, Take 81 mg by mouth daily., Disp: , Rfl:  .  atorvastatin (LIPITOR) 80 MG tablet, Take 80 mg by mouth daily at 6 PM., Disp: , Rfl:  .  carvedilol (COREG) 12.5 MG tablet, Take 12.5 mg by mouth 2 (two) times daily., Disp: , Rfl:  .  chlorthalidone (HYGROTON) 25 MG tablet, Take 1 tablet (25 mg total) by mouth every morning., Disp: 30 tablet, Rfl: 0 .  Dulaglutide (TRULICITY) 1.5 MG/0.5ML SOPN, Inject 1.5 mg into the skin every 7 (seven) days., Disp: , Rfl:  .  insulin glargine (LANTUS) 100 UNIT/ML injection, Inject 30 Units into the skin 2 (two) times daily., Disp: , Rfl:  .  nitroGLYCERIN (NITROSTAT) 0.4 MG SL tablet, Place 0.4 mg under the tongue every 5 (five) minutes as needed for chest pain., Disp: , Rfl:  .  potassium chloride SA (K-DUR,KLOR-CON) 20 MEQ tablet, Take 1 tablet (20 mEq total) by mouth daily., Disp: 30 tablet, Rfl: 0 .  rOPINIRole (REQUIP) 2 MG tablet, Take 2 mg by mouth at bedtime., Disp: , Rfl:  .  sacubitril-valsartan (ENTRESTO) 24-26 MG, Take 1 tablet by mouth 2 (two) times daily., Disp: , Rfl:  .  sertraline (ZOLOFT) 100 MG tablet, Take 100 mg by mouth daily., Disp: , Rfl:  .  solifenacin (VESICARE) 5 MG tablet, Take 5 mg by mouth every evening., Disp: , Rfl:  .  ticagrelor (BRILINTA) 90 MG TABS  tablet, Take 90 mg by mouth 2 (two) times daily. , Disp: , Rfl:   Social History   Tobacco Use  Smoking Status Former Smoker  Smokeless Tobacco Never Used    Allergies  Allergen Reactions  . Penicillins Other (See Comments)    Has patient had a PCN reaction causing immediate rash, facial/tongue/throat swelling, SOB or lightheadedness with hypotension: unknown  Has patient had a PCN reaction causing severe rash involving mucus membranes or skin necrosis: unknown Has patient had a PCN reaction that required hospitalization {unknown  Has patient had a PCN reaction occurring within the last 10 years: No If all of the above answers are "NO", then may proceed with Cephalosporin use.   Marland Kitchen Percodan [Oxycodone-Aspirin] Other (See Comments)    Altered mental status  . Sulfa Antibiotics Other (See Comments)    unknown  . Epinephrine Other (See Comments) and Palpitations    Tachycardia Heart palpations   Objective:  There were no vitals filed for this visit. There is no height or weight on file to calculate BMI. Constitutional Well developed. Well nourished.  Vascular Dorsalis pedis pulses present 1+ bilaterally  Posterior tibial pulses present 1+ bilaterally  Pedal hair growth diminished. Capillary refill normal to all digits.  No cyanosis or clubbing noted.  Neurologic Normal speech. Oriented  to person, place, and time. Epicritic sensation to light touch grossly present bilaterally. Protective sensation with 5.07 monofilament  absent bilaterally.  Dermatologic Nails elongated, thickened, dystrophic. No open wounds. No skin lesions.  Orthopedic: Normal joint ROM without pain or crepitus bilaterally. HAV bilat No bony tenderness.   Assessment:   1. DM type 2 with diabetic peripheral neuropathy (HCC)   2. Onychomycosis    Plan:  Patient was evaluated and treated and all questions answered.  Diabetes with PAD, Onychomycosis -Educated on diabetic footcare. Diabetic risk level  3 -Nails x10 debrided sharply and manually with large nail nipper and rotary burr.   Procedure: Nail Debridement Rationale: Patient meets criteria for routine foot care due to DPN Type of Debridement: manual, sharp debridement. Instrumentation: Nail nipper, rotary burr. Number of Nails: 10   Return in about 3 months (around 04/19/2019) for Diabetic Foot Care.

## 2019-04-15 DIAGNOSIS — G2581 Restless legs syndrome: Secondary | ICD-10-CM | POA: Diagnosis not present

## 2019-04-15 DIAGNOSIS — I10 Essential (primary) hypertension: Secondary | ICD-10-CM | POA: Diagnosis not present

## 2019-04-15 DIAGNOSIS — G4733 Obstructive sleep apnea (adult) (pediatric): Secondary | ICD-10-CM | POA: Diagnosis not present

## 2019-04-15 DIAGNOSIS — I739 Peripheral vascular disease, unspecified: Secondary | ICD-10-CM | POA: Diagnosis not present

## 2019-04-15 DIAGNOSIS — I255 Ischemic cardiomyopathy: Secondary | ICD-10-CM | POA: Diagnosis not present

## 2019-04-15 DIAGNOSIS — E782 Mixed hyperlipidemia: Secondary | ICD-10-CM | POA: Diagnosis not present

## 2019-04-15 DIAGNOSIS — I2510 Atherosclerotic heart disease of native coronary artery without angina pectoris: Secondary | ICD-10-CM | POA: Diagnosis not present

## 2019-04-15 DIAGNOSIS — E1151 Type 2 diabetes mellitus with diabetic peripheral angiopathy without gangrene: Secondary | ICD-10-CM | POA: Diagnosis not present

## 2019-04-19 ENCOUNTER — Ambulatory Visit: Payer: Medicare Other | Admitting: Podiatry

## 2019-04-29 ENCOUNTER — Ambulatory Visit: Payer: Self-pay | Admitting: Cardiology

## 2019-04-30 DIAGNOSIS — I10 Essential (primary) hypertension: Secondary | ICD-10-CM | POA: Diagnosis not present

## 2019-04-30 DIAGNOSIS — G2581 Restless legs syndrome: Secondary | ICD-10-CM | POA: Diagnosis not present

## 2019-04-30 DIAGNOSIS — G4733 Obstructive sleep apnea (adult) (pediatric): Secondary | ICD-10-CM | POA: Diagnosis not present

## 2019-04-30 DIAGNOSIS — E782 Mixed hyperlipidemia: Secondary | ICD-10-CM | POA: Diagnosis not present

## 2019-04-30 DIAGNOSIS — E1151 Type 2 diabetes mellitus with diabetic peripheral angiopathy without gangrene: Secondary | ICD-10-CM | POA: Diagnosis not present

## 2019-06-04 DIAGNOSIS — E119 Type 2 diabetes mellitus without complications: Secondary | ICD-10-CM | POA: Diagnosis not present

## 2019-07-22 DIAGNOSIS — I70203 Unspecified atherosclerosis of native arteries of extremities, bilateral legs: Secondary | ICD-10-CM | POA: Diagnosis not present

## 2019-07-22 DIAGNOSIS — E1042 Type 1 diabetes mellitus with diabetic polyneuropathy: Secondary | ICD-10-CM | POA: Diagnosis not present

## 2019-07-22 DIAGNOSIS — E782 Mixed hyperlipidemia: Secondary | ICD-10-CM | POA: Diagnosis not present

## 2019-07-22 DIAGNOSIS — L603 Nail dystrophy: Secondary | ICD-10-CM | POA: Diagnosis not present

## 2019-07-22 DIAGNOSIS — I10 Essential (primary) hypertension: Secondary | ICD-10-CM | POA: Diagnosis not present

## 2019-07-22 DIAGNOSIS — Z1231 Encounter for screening mammogram for malignant neoplasm of breast: Secondary | ICD-10-CM | POA: Diagnosis not present

## 2019-07-22 DIAGNOSIS — E1051 Type 1 diabetes mellitus with diabetic peripheral angiopathy without gangrene: Secondary | ICD-10-CM | POA: Diagnosis not present

## 2019-07-22 DIAGNOSIS — E1151 Type 2 diabetes mellitus with diabetic peripheral angiopathy without gangrene: Secondary | ICD-10-CM | POA: Diagnosis not present

## 2019-07-31 DIAGNOSIS — E782 Mixed hyperlipidemia: Secondary | ICD-10-CM | POA: Diagnosis not present

## 2019-07-31 DIAGNOSIS — I739 Peripheral vascular disease, unspecified: Secondary | ICD-10-CM | POA: Diagnosis not present

## 2019-07-31 DIAGNOSIS — I10 Essential (primary) hypertension: Secondary | ICD-10-CM | POA: Diagnosis not present

## 2019-07-31 DIAGNOSIS — G2581 Restless legs syndrome: Secondary | ICD-10-CM | POA: Diagnosis not present

## 2019-07-31 DIAGNOSIS — E1151 Type 2 diabetes mellitus with diabetic peripheral angiopathy without gangrene: Secondary | ICD-10-CM | POA: Diagnosis not present

## 2019-07-31 DIAGNOSIS — I2510 Atherosclerotic heart disease of native coronary artery without angina pectoris: Secondary | ICD-10-CM | POA: Diagnosis not present

## 2019-07-31 DIAGNOSIS — I255 Ischemic cardiomyopathy: Secondary | ICD-10-CM | POA: Diagnosis not present

## 2019-07-31 DIAGNOSIS — N139 Obstructive and reflux uropathy, unspecified: Secondary | ICD-10-CM | POA: Diagnosis not present

## 2019-08-09 DIAGNOSIS — M85851 Other specified disorders of bone density and structure, right thigh: Secondary | ICD-10-CM | POA: Diagnosis not present

## 2019-08-09 DIAGNOSIS — M85852 Other specified disorders of bone density and structure, left thigh: Secondary | ICD-10-CM | POA: Diagnosis not present

## 2019-08-09 DIAGNOSIS — Z780 Asymptomatic menopausal state: Secondary | ICD-10-CM | POA: Diagnosis not present

## 2019-08-09 DIAGNOSIS — M810 Age-related osteoporosis without current pathological fracture: Secondary | ICD-10-CM | POA: Diagnosis not present

## 2019-08-09 DIAGNOSIS — N139 Obstructive and reflux uropathy, unspecified: Secondary | ICD-10-CM | POA: Diagnosis not present

## 2019-10-24 DIAGNOSIS — G4733 Obstructive sleep apnea (adult) (pediatric): Secondary | ICD-10-CM | POA: Diagnosis not present

## 2019-10-25 DIAGNOSIS — R197 Diarrhea, unspecified: Secondary | ICD-10-CM | POA: Diagnosis not present

## 2019-10-25 DIAGNOSIS — D649 Anemia, unspecified: Secondary | ICD-10-CM | POA: Diagnosis not present

## 2019-10-25 DIAGNOSIS — I10 Essential (primary) hypertension: Secondary | ICD-10-CM | POA: Diagnosis not present

## 2019-10-25 DIAGNOSIS — E11622 Type 2 diabetes mellitus with other skin ulcer: Secondary | ICD-10-CM | POA: Diagnosis not present

## 2019-10-28 DIAGNOSIS — I10 Essential (primary) hypertension: Secondary | ICD-10-CM | POA: Diagnosis not present

## 2019-10-28 DIAGNOSIS — D649 Anemia, unspecified: Secondary | ICD-10-CM | POA: Diagnosis not present

## 2019-10-28 DIAGNOSIS — R197 Diarrhea, unspecified: Secondary | ICD-10-CM | POA: Diagnosis not present

## 2019-10-29 DIAGNOSIS — R197 Diarrhea, unspecified: Secondary | ICD-10-CM | POA: Diagnosis not present

## 2019-11-05 DIAGNOSIS — R1033 Periumbilical pain: Secondary | ICD-10-CM | POA: Diagnosis not present

## 2019-11-05 DIAGNOSIS — K219 Gastro-esophageal reflux disease without esophagitis: Secondary | ICD-10-CM | POA: Diagnosis not present

## 2019-11-05 DIAGNOSIS — R194 Change in bowel habit: Secondary | ICD-10-CM | POA: Diagnosis not present

## 2019-11-25 DIAGNOSIS — R197 Diarrhea, unspecified: Secondary | ICD-10-CM | POA: Diagnosis not present

## 2019-11-26 DIAGNOSIS — Z1159 Encounter for screening for other viral diseases: Secondary | ICD-10-CM | POA: Diagnosis not present

## 2019-11-26 DIAGNOSIS — E538 Deficiency of other specified B group vitamins: Secondary | ICD-10-CM | POA: Diagnosis not present

## 2019-11-26 DIAGNOSIS — D649 Anemia, unspecified: Secondary | ICD-10-CM | POA: Diagnosis not present

## 2019-11-26 DIAGNOSIS — E11622 Type 2 diabetes mellitus with other skin ulcer: Secondary | ICD-10-CM | POA: Diagnosis not present

## 2019-11-26 DIAGNOSIS — N183 Chronic kidney disease, stage 3 unspecified: Secondary | ICD-10-CM | POA: Diagnosis not present

## 2020-03-24 ENCOUNTER — Other Ambulatory Visit: Payer: Self-pay

## 2020-03-24 ENCOUNTER — Telehealth: Payer: Self-pay

## 2020-03-24 ENCOUNTER — Emergency Department (HOSPITAL_COMMUNITY): Payer: Medicare HMO

## 2020-03-24 ENCOUNTER — Encounter (HOSPITAL_COMMUNITY): Payer: Self-pay

## 2020-03-24 ENCOUNTER — Emergency Department (HOSPITAL_COMMUNITY)
Admission: EM | Admit: 2020-03-24 | Discharge: 2020-03-24 | Disposition: A | Discharge status: Home / Self Care | Payer: Medicare HMO | Attending: Emergency Medicine | Admitting: Emergency Medicine

## 2020-03-24 DIAGNOSIS — Z87891 Personal history of nicotine dependence: Secondary | ICD-10-CM | POA: Insufficient documentation

## 2020-03-24 DIAGNOSIS — R0602 Shortness of breath: Secondary | ICD-10-CM | POA: Diagnosis not present

## 2020-03-24 DIAGNOSIS — I110 Hypertensive heart disease with heart failure: Secondary | ICD-10-CM | POA: Insufficient documentation

## 2020-03-24 DIAGNOSIS — I5022 Chronic systolic (congestive) heart failure: Secondary | ICD-10-CM | POA: Insufficient documentation

## 2020-03-24 DIAGNOSIS — E875 Hyperkalemia: Secondary | ICD-10-CM | POA: Diagnosis not present

## 2020-03-24 DIAGNOSIS — R531 Weakness: Secondary | ICD-10-CM | POA: Diagnosis not present

## 2020-03-24 LAB — CBG MONITORING, ED: Glucose-Capillary: 119 mg/dL — ABNORMAL HIGH (ref 70–99)

## 2020-03-24 LAB — BASIC METABOLIC PANEL
Anion gap: 6 (ref 5–15)
BUN: 21 mg/dL (ref 8–23)
CO2: 23 mmol/L (ref 22–32)
Calcium: 9.1 mg/dL (ref 8.9–10.3)
Chloride: 113 mmol/L — ABNORMAL HIGH (ref 98–111)
Creatinine, Ser: 1.41 mg/dL — ABNORMAL HIGH (ref 0.44–1.00)
GFR calc Af Amer: 43 mL/min — ABNORMAL LOW (ref >60)
GFR calc non Af Amer: 37 mL/min — ABNORMAL LOW (ref >60)
Glucose, Bld: 127 mg/dL — ABNORMAL HIGH (ref 70–99)
Potassium: 5.2 mmol/L — ABNORMAL HIGH (ref 3.5–5.1)
Sodium: 142 mmol/L (ref 135–145)

## 2020-03-24 LAB — CBC
HCT: 33.3 % — ABNORMAL LOW (ref 36.0–46.0)
Hemoglobin: 10.7 g/dL — ABNORMAL LOW (ref 12.0–15.0)
MCH: 30.2 pg (ref 26.0–34.0)
MCHC: 32.1 g/dL (ref 30.0–36.0)
MCV: 94.1 fL (ref 80.0–100.0)
Platelets: 266 10*3/uL (ref 150–400)
RBC: 3.54 MIL/uL — ABNORMAL LOW (ref 3.87–5.11)
RDW: 13.8 % (ref 11.5–15.5)
WBC: 8 10*3/uL (ref 4.0–10.5)
nRBC: 0 % (ref 0.0–0.2)

## 2020-03-24 LAB — BRAIN NATRIURETIC PEPTIDE: B Natriuretic Peptide: 100.6 pg/mL — ABNORMAL HIGH (ref 0.0–100.0)

## 2020-03-24 NOTE — ED Triage Notes (Addendum)
Patient c/o spots and blurred vision a few days ago. Patient states she was told by others that she sounded SOB when on the phone. Patient states, "I have a hard time determining if I am SOB or having weakness. Patient is now sure when either started. Patient states she lives half of the year in [place] and half here in [place]. Patient states she could not get her PCP today so she called her PCP in [place] and was told to come to the ED.  Patient added that she has ben off of her diuretic and BP meds as ordered. Patient states she gained 5 lbs in one day so she decided to restart her BP med and diuretic on her own today.

## 2020-03-24 NOTE — ED Provider Notes (Signed)
[facility] Emergency Department Provider Note MRN:  [id number]  Arrival date & time: 03/24/20     Chief Complaint   Shortness of Breath, Blurred Vision, Dizziness, and Weakness   History of Present Illness   Christine Levy is a 74 y.o. year-old female with a history of cardiomyopathy presenting to the ED with chief complaint of weakness.  Patient cannot tell if she is experiencing weakness or shortness of breath.  Has been checking her blood pressure at home every 1 or 2 hours and seems to think there is a correlation between her symptoms and the diastolic number.  Denies chest pain, no numbness or weakness.  No fever, no cough.  Also has spotty blurred vision intermittently, lightheadedness on occasion.  Review of Systems  A complete 10 system review of systems was obtained and all systems are negative except as noted in the HPI and PMH.   Patient's Health History    Past Medical History:  Diagnosis Date  . Acute respiratory failure (HCC)   . Cardiomyopathy (HCC)   . Hypertension   . MI (myocardial infarction) (HCC)   . Systolic heart failure [facility])     Past Surgical History:  Procedure Laterality Date  . ABDOMINAL AORTOGRAM N/A 02/21/2017   Procedure: Abdominal Aortogram;  Surgeon: Yates Decamp, MD;  Location: [facility] INVASIVE CV LAB;  Service: Cardiovascular;  Laterality: N/A;  . LOWER EXTREMITY ANGIOGRAPHY N/A 02/21/2017   Procedure: Lower Extremity Angiography;  Surgeon: Yates Decamp, MD;  Location: [facility] INVASIVE CV LAB;  Service: Cardiovascular;  Laterality: N/A;  . PERIPHERAL VASCULAR INTERVENTION  02/21/2017   Procedure: Peripheral Vascular Intervention;  Surgeon: Yates Decamp, MD;  Location: [facility] INVASIVE CV LAB;  Service: Cardiovascular;;    History reviewed. No pertinent family history.  Social History   Socioeconomic History  . Marital status: Widowed    Spouse name: Not on file  . Number of children: Not on file  . Years of education: Not on file  . Highest  education level: Not on file  Occupational History  . Not on file  Tobacco Use  . Smoking status: Former Games developer  . Smokeless tobacco: Never Used  Substance and Sexual Activity  . Alcohol use: Never  . Drug use: Never  . Sexual activity: Not on file  Other Topics Concern  . Not on file  Social History Narrative  . Not on file   Social Determinants of Health   Financial Resource Strain:   . Difficulty of Paying Living Expenses:   Food Insecurity:   . Worried About Programme researcher, broadcasting/film/video in the Last Year:   . Barista in the Last Year:   Transportation Needs:   . Freight forwarder (Medical):   Marland Kitchen Lack of Transportation (Non-Medical):   Physical Activity:   . Days of Exercise per Week:   . Minutes of Exercise per Session:   Stress:   . Feeling of Stress :   Social Connections:   . Frequency of Communication with Friends and Family:   . Frequency of Social Gatherings with Friends and Family:   . Attends Religious Services:   . Active Member of Clubs or Organizations:   . Attends Banker Meetings:   Marland Kitchen Marital Status:   Intimate Partner Violence:   . Fear of Current or Ex-Partner:   . Emotionally Abused:   Marland Kitchen Physically Abused:   . Sexually Abused:      Physical Exam   Vitals:   03/24/20  1623 03/24/20 2100  BP: 125/62 (!) 130/58  Pulse: 80 71  Resp: 16 18  Temp: 98.4 F (36.9 C)   SpO2: 99% 99%    CONSTITUTIONAL: Well-appearing, NAD NEURO:  Alert and oriented x 3, no focal deficits EYES:  eyes equal and reactive ENT/NECK:  no LAD, no JVD CARDIO: Regular rate, well-perfused, normal S1 and S2 PULM:  CTAB no wheezing or rhonchi GI/GU:  normal bowel sounds, non-distended, non-tender MSK/SPINE:  No gross deformities, no edema SKIN:  no rash, atraumatic PSYCH:  Appropriate speech and behavior  *Additional and/or pertinent findings included in MDM below  Diagnostic and Interventional Summary    EKG Interpretation  Date/Time:  March 24, 2020 at 21: 21: 34 Ventricular Rate:  69 PR Interval:   QRS Duration: 138 QT Interval:  428 QTC Calculation: 459 R Axis:     Text Interpretation: Sinus rhythm, left bundle branch block, unchanged from prior Confirmed by Dr. Gerlene Fee at 9:34 PM      Labs Reviewed  CBC - Abnormal; Notable for the following components:      Result Value   RBC 3.54 (*)    Hemoglobin 10.7 (*)    HCT 33.3 (*)    All other components within normal limits  BRAIN NATRIURETIC PEPTIDE - Abnormal; Notable for the following components:   B Natriuretic Peptide 100.6 (*)    All other components within normal limits  BASIC METABOLIC PANEL - Abnormal; Notable for the following components:   Potassium 5.2 (*)    Chloride 113 (*)    Glucose, Bld 127 (*)    Creatinine, Ser 1.41 (*)    GFR calc non Af Amer 37 (*)    GFR calc Af Amer 43 (*)    All other components within normal limits  CBG MONITORING, ED - Abnormal; Notable for the following components:   Glucose-Capillary 119 (*)    All other components within normal limits  URINALYSIS, ROUTINE W REFLEX MICROSCOPIC  CBG MONITORING, ED  CBG MONITORING, ED    DG Chest 2 View  Final Result      Medications - No data to display   Procedures  /  Critical Care Procedures  ED Course and Medical Decision Making  I have reviewed the triage vital signs, the nursing notes, and pertinent available records from the EMR.  Pertinent labs & imaging results that were available during my care of the patient were reviewed by me and considered in my medical decision making (see below for details).     This is largely a well visit with a very mild and very vague symptoms.  Patient comes with a blood pressure and blood glucose diary all with very reassuring numbers.  She is preoccupied about very small changes in these numbers.  She is well-appearing with normal vital signs, currently without symptoms or complaints.  Her labs reveal a potassium of 5.1 with kidney  function increased from prior though she spends half of the year in [place] and we do not have a recent creatinine.  I advised her to stop taking her potassium supplement and have her labs rechecked within the next week.  No signs of emergent condition this evening.    Barth Kirks. Sedonia Small, MD [place] [email or web address]  Final Clinical Impressions(s) / ED Diagnoses     ICD-10-CM   1. Hyperkalemia  E87.5     ED Discharge Orders    None  Discharge Instructions Discussed with and Provided to Patient:     Discharge Instructions     You were evaluated in the Emergency Department and after careful evaluation, we did not find any emergent condition requiring admission or further testing in the hospital.  Your potassium level was mildly elevated.  As discussed, please stop taking your potassium supplement and follow-up closely with your primary care doctor for lab recheck within the next week.  You can return to the emergency department for recheck if you cannot establish a lab recheck with your doctor.  Please return to the Emergency Department if you experience any worsening of your condition.  We encourage you to follow up with a primary care provider.  Thank you for allowing [place] to be a part of your care.       Sabas Sous, MD 03/24/20 2153

## 2020-03-24 NOTE — Discharge Instructions (Addendum)
You were evaluated in the Emergency Department and after careful evaluation, we did not find any emergent condition requiring admission or further testing in the hospital.  Your potassium level was mildly elevated.  As discussed, please stop taking your potassium supplement and follow-up closely with your primary care doctor for lab recheck within the next week.  You can return to the emergency department for recheck if you cannot establish a lab recheck with your doctor.  Please return to the Emergency Department if you experience any worsening of your condition.  We encourage you to follow up with a primary care provider.  Thank you for allowing [place] to be a part of your care.

## 2020-03-24 NOTE — Telephone Encounter (Signed)
Can she see anyone else in our office

## 2020-03-25 ENCOUNTER — Encounter: Payer: Self-pay | Admitting: Cardiology

## 2020-03-25 ENCOUNTER — Ambulatory Visit: Payer: Medicare HMO | Admitting: Cardiology

## 2020-03-25 VITALS — BP 110/56 | HR 67 | Temp 98.7°F | Resp 17 | Ht 61.0 in | Wt 223.0 lb

## 2020-03-25 DIAGNOSIS — R0609 Other forms of dyspnea: Secondary | ICD-10-CM | POA: Insufficient documentation

## 2020-03-25 DIAGNOSIS — I951 Orthostatic hypotension: Secondary | ICD-10-CM | POA: Insufficient documentation

## 2020-03-25 DIAGNOSIS — I25118 Atherosclerotic heart disease of native coronary artery with other forms of angina pectoris: Secondary | ICD-10-CM | POA: Diagnosis not present

## 2020-03-25 DIAGNOSIS — I255 Ischemic cardiomyopathy: Secondary | ICD-10-CM

## 2020-03-25 DIAGNOSIS — E875 Hyperkalemia: Secondary | ICD-10-CM | POA: Insufficient documentation

## 2020-03-25 NOTE — Telephone Encounter (Signed)
Mp will see her today thankyou!

## 2020-03-25 NOTE — Patient Instructions (Signed)
Stop Entresto Stop potassium Stop Brilinta Reduce carvedilol to 1/2 tab 12.5 mg twice daily Take chlorthalidone only if you gain more than 2 lbs weight/day

## 2020-03-25 NOTE — Progress Notes (Signed)
Follow up visit  Subjective:   Christine Levy, female    DOB: 10-01-1946, 74 y.o.   MRN: [id number]   HPI   Chief Complaint  Patient presents with  . Shortness of Breath  . Hospitalization Follow-up    34/80    74 year old Caucasian female with hypertension, type 2 diabetes mellitus, coronary artery disease status post overlapping stents in LAD 2019, ischemic cardiomyopathy with recovered EF 55 to 60% in 2019, PAD status post bilateral iliac stenting 2018.  Patient spends 6 months in [place], 6 months in [place].  She was last seen by Dr. Einar Gip in 2019.  Recently, patient has experienced complains of lightheadedness.  She has also experienced exertional dyspnea with minimal activity.  She denies any chest pain.  That said, patient did not have any chest pain when she had her MI.  She describes her shortness of breath is not as worse as it was during her MI in 2017.  Recently, she stopped her diuretic chlorthalidone due to symptoms of lightheadedness.  However, she gained 5 pounds weight in 2 days.  Patient was [facility] in [facility] emergency department on 03/24/2020.  Her work-up showed mildly elevated potassium at 5.2.    Current Outpatient Medications on File Prior to Visit  Medication Sig Dispense Refill  . aspirin EC 81 MG tablet Take 81 mg by mouth daily.    Marland Kitchen atorvastatin (LIPITOR) 40 MG tablet Take 40 mg by mouth in the morning and at bedtime.    . Biotin w/ Vitamins C & E (HAIR SKIN & NAILS GUMMIES PO) Take 1 tablet by mouth every evening.    . carvedilol (COREG) 12.5 MG tablet Take 12.5 mg by mouth 2 (two) times daily.    . chlorthalidone (HYGROTON) 25 MG tablet Take 1 tablet (25 mg total) by mouth every morning. (Patient taking differently: Take 12.5-25 mg by mouth See admin instructions. Patient states she starts taking 12.69m tablet by mouth if she doesn't have a lot of fluid if continues she takes 230mtablet by mouth per patient.) 30 tablet 0  . cyanocobalamin 2000  MCG tablet Take 2,000 mcg by mouth daily.    . Dulaglutide (TRULICITY) 1.5 MGVF/6.4PPOPN Inject 1.5 mg into the skin every 7 (seven) days.    . insulin glargine (LANTUS) 100 UNIT/ML injection Inject 30 Units into the skin 2 (two) times daily.    . nitroGLYCERIN (NITROSTAT) 0.4 MG SL tablet Place 0.4 mg under the tongue every 5 (five) minutes as needed for chest pain.    . Marland KitchenARoxetine (PAXIL) 10 MG tablet Take 10 mg by mouth daily.    . Probiotic Product (PROBIOTIC PO) Take 1 tablet by mouth daily.    . Marland KitchenOPINIRole (REQUIP) 2 MG tablet Take 2-2.5 mg by mouth See admin instructions. Take 85m40mablet by mouth in the morning then take 2.5mg87m the evening    . sacubitril-valsartan (ENTRESTO) 24-26 MG Take 1 tablet by mouth 2 (two) times daily.     No current facility-administered medications on file prior to visit.    Cardiovascular & other pertient studies:  EKG 03/25/2020: Sinus rhythm 84 bpm. Left bundle branch block.  Ventricular trigeminy    Echocardiogram 01/04/2018: LVEF 55-60%. Grade 2 DD. Mod LA dilatation. Trivial aortic valve prolapse. Mild AI. Mild-mod TR. Estimated PASP 48 mmHg. Small pericardial effusion.  Chest Xray 03/24/2020: Heart size within normal limits. Aortic atherosclerosis. No evidence of airspace consolidation within the lungs. No evidence of pleural effusion or  pneumothorax. No acute bony abnormality. Thoracic spondylosis.  IMPRESSION: No evidence of acute cardiopulmonary abnormality. Aortic atherosclerosis.  Lower extremity angiography/intervention 01/2017: Angiographic data: Abdominal aorta shows mild atherosclerotic changes. Only the distal abdominal aorta visualized. The right common iliac artery is occluded and fills by collaterals. Right common femoral artery is widely patent. Left iliac artery shows a 40-50% stenosis at ostium, mild disease in the left external iliac artery. Left femoral artery is widely patent.  Successful PTA and stenting of the  bilateral common iliac artery with implantation of 9.0 x 37 mm express LD on the right common iliac artery and 9.0 x 25 mm express LD on the left common iliac artery, stenosis reduced from 100% to 0% on the right and 50% to 0% on the left.  Coronary angiogram 2017 [facility]): Severe diffuse triple-vessel coronary disease, small calibered vessels.   Distal circumflex 80%, distal PL branch 80%, LAD 90% stenosis, successful angioplasty with placement of 2.25 x 12, 2.25 x 23, 2.5 x 33 and 2.5 x 8 overlapping  Xience DES.  Mid to distal anteroapical and anterior and inferoapical dyskinesis, LVEF 25%.  Recent labs: 03/24/2020: Glucose 127, BUN/Cr 21/1.41. EGFR 37. Na/K 142/5.2.  H/H 10.7/33.3. MCV 94. Platelets 266 BNP 100    Review of Systems  Cardiovascular: Positive for dyspnea on exertion. Negative for chest pain, leg swelling, palpitations and syncope.  Neurological: Positive for light-headedness.         Vitals:   03/25/20 1512  BP: (!) 110/56  Pulse: 67  Resp: 17  Temp: 98.7 F (37.1 C)  SpO2: 98%   Orthostatic VS for the past 72 hrs (Last 3 readings):  Orthostatic BP Patient Position BP Location Cuff Size Orthostatic Pulse  03/25/20 1544 (!) 79/49 Standing Left Arm Large 91  03/25/20 1543 111/53 Sitting Left Arm Large 74  03/25/20 1542 108/46 Supine Left Arm Large 77     Body mass index is 42.14 kg/m. Filed Weights   03/25/20 1512  Weight: 223 lb (101.2 kg)     Objective:   Physical Exam  Constitutional: She appears well-developed and well-nourished.  Neck: No JVD present.  Cardiovascular: Normal rate, regular rhythm, normal heart sounds and intact distal pulses.  No murmur heard. Pulmonary/Chest: Effort normal and breath sounds normal. She has no wheezes. She has no rales.  Musculoskeletal:        General: No edema.  Nursing note and vitals reviewed.         Assessment & Recommendations:   74 year old Caucasian female with hypertension, type 2 diabetes  mellitus, coronary artery disease status post overlapping stents in LAD 2019, ischemic cardiomyopathy with recovered EF 55 to 60% in 2019, PAD status post bilateral iliac stenting 2018, now with lightheadedness, shortness of breath.  Lightheadedness: Patient has profound orthostatic hypotension, associated with symptoms.  While supine, she is normotensive.  I stopped her Entresto, reduce carvedilol to 6.25 mg twice daily.  She should only take chlorthalidone, if she has more than 2 pound weight gain in 1 day.  I suspect patient may have combination of dehydration as well as, potentially reduced LVEF.  Will obtain echocardiogram.  Exertional dyspnea: Could well be angina equivalent.  Will obtain nuclear stress test.  Hyperkalemia: Borderline elevated potassium 5.2 on 03/24/2020.  Stop Entresto.  Recheck BMP in 1 week.  Coronary artery disease: Primary PCI to LAD in [place] in 2017.  Possible symptoms of angina equivalent, as described above.   She is currently on aspirin and Brilinta for years  out of her primary PCI.  It is also more than 2 years since her peripheral iliac artery stenting.  No indication for dual antiplatelet therapy at this time.  Stop Brilinta.  This can be restarted in future, should she require coronary stenting.   Will check lipid panel today.    Follow-up with Dr. Einar Gip after above testing.  Nigel Mormon, MD [facility] Pager: [phone] Office: [phone]

## 2020-04-01 DIAGNOSIS — I25118 Atherosclerotic heart disease of native coronary artery with other forms of angina pectoris: Secondary | ICD-10-CM | POA: Diagnosis not present

## 2020-04-02 LAB — LIPID PANEL
Chol/HDL Ratio: 3.7 ratio (ref 0.0–4.4)
Cholesterol, Total: 129 mg/dL (ref 100–199)
HDL: 35 mg/dL — ABNORMAL LOW (ref >39)
LDL Chol Calc (NIH): 71 mg/dL (ref 0–99)
Triglycerides: 130 mg/dL (ref 0–149)
VLDL Cholesterol Cal: 23 mg/dL (ref 5–40)

## 2020-04-06 ENCOUNTER — Ambulatory Visit: Payer: Medicare HMO

## 2020-04-06 ENCOUNTER — Other Ambulatory Visit: Payer: Self-pay

## 2020-04-06 DIAGNOSIS — R0609 Other forms of dyspnea: Secondary | ICD-10-CM

## 2020-04-17 ENCOUNTER — Other Ambulatory Visit: Payer: Self-pay

## 2020-04-17 ENCOUNTER — Ambulatory Visit: Payer: Medicare HMO | Admitting: Cardiology

## 2020-04-17 ENCOUNTER — Encounter: Payer: Self-pay | Admitting: Cardiology

## 2020-04-17 VITALS — BP 130/54 | HR 81 | Temp 95.8°F | Resp 15 | Ht 61.0 in | Wt 231.0 lb

## 2020-04-17 DIAGNOSIS — I255 Ischemic cardiomyopathy: Secondary | ICD-10-CM

## 2020-04-17 DIAGNOSIS — I340 Nonrheumatic mitral (valve) insufficiency: Secondary | ICD-10-CM | POA: Insufficient documentation

## 2020-04-17 DIAGNOSIS — I25118 Atherosclerotic heart disease of native coronary artery with other forms of angina pectoris: Secondary | ICD-10-CM

## 2020-04-17 DIAGNOSIS — I272 Pulmonary hypertension, unspecified: Secondary | ICD-10-CM | POA: Insufficient documentation

## 2020-04-17 MED ORDER — METOPROLOL SUCCINATE ER 25 MG PO TB24: 25.0000 mg | ORAL_TABLET | Freq: Every day | ORAL | 2 refills | Status: DC

## 2020-04-17 NOTE — Progress Notes (Signed)
Follow up visit  Subjective:   Christine Levy, female    DOB: 02/18/1946, 75 y.o.   MRN: [id number]   HPI   Chief Complaint  Patient presents with  . Follow-up    lab results  . Results    74 year old Caucasian female with hypertension, type 2 diabetes mellitus, coronary artery disease status post overlapping stents in LAD 2019, ischemic cardiomyopathy with recovered EF 55 to 60% in 2019, PAD status post bilateral iliac stenting 2018.  Since her last visit with me, her lightheadedness and exertional dyspnea symptoms  have improved. Echocardiogram and stress test findings improved with the patient. She denies any chest pain.  OV 02/2020: Patient spends 6 months in [place], 6 months in [place].  She was last seen by Dr. Einar Gip in 2019.  Recently, patient has experienced complains of lightheadedness.  She has also experienced exertional dyspnea with minimal activity.  She denies any chest pain.  That said, patient did not have any chest pain when she had her MI.  She describes her shortness of breath is not as worse as it was during her MI in 2017.  Recently, she stopped her diuretic chlorthalidone due to symptoms of lightheadedness.  However, she gained 5 pounds weight in 2 days.  Patient was seen in [facility] long hospital emergency department on 03/24/2020.  Her work-up showed mildly elevated potassium at 5.2.    Current Outpatient Medications on File Prior to Visit  Medication Sig Dispense Refill  . aspirin EC 81 MG tablet Take 81 mg by mouth daily.    Marland Kitchen atorvastatin (LIPITOR) 40 MG tablet Take 40 mg by mouth in the morning and at bedtime.    . Biotin w/ Vitamins C & E (HAIR SKIN & NAILS GUMMIES PO) Take 1 tablet by mouth every evening.    . carvedilol (COREG) 12.5 MG tablet Take 6.25 mg by mouth 2 (two) times daily.    . chlorthalidone (HYGROTON) 25 MG tablet Take 1 tablet (25 mg total) by mouth every morning. 30 tablet 0  . cyanocobalamin 2000 MCG tablet Take 2,000 mcg by mouth  daily.    . Dulaglutide (TRULICITY) 1.5 WI/2.0BT SOPN Inject 1.5 mg into the skin every 7 (seven) days.    . insulin glargine (LANTUS) 100 UNIT/ML injection Inject 30 Units into the skin 2 (two) times daily.    . nitroGLYCERIN (NITROSTAT) 0.4 MG SL tablet Place 0.4 mg under the tongue every 5 (five) minutes as needed for chest pain.    Marland Kitchen PARoxetine (PAXIL) 10 MG tablet Take 10 mg by mouth daily.    . Probiotic Product (PROBIOTIC PO) Take 1 tablet by mouth daily.    Marland Kitchen rOPINIRole (REQUIP) 2 MG tablet Take 2-2.5 mg by mouth See admin instructions. Take 36m tablet by mouth in the morning then take 2.565min the evening    . sacubitril-valsartan (ENTRESTO) 49-51 MG Take 1 tablet by mouth 2 (two) times daily.     No current facility-administered medications on file prior to visit.    Cardiovascular & other pertient studies:  Echocardiogram 04/06/2020:  Left ventricle cavity is normal in size. Moderate concentric hypertrophy  of the left ventricle. Normal global wall motion. Normal LV systolic  function with EF 55%. Doppler evidence of grade I (impaired) diastolic  dysfunction, normal LAP.  Left atrial cavity is normal in size.  Moderate (Grade III), eccentric, posteriorly directed mitral  regurgitation.  Moderate tricuspid regurgitation. Estimated pulmonary artery systolic  pressure is 48 mmHg.  Unlike  previous study in 2018, PFO not appreciated. Estimated PASP  increased from 37 mmHg to 48 mmHg.   Lexiscan Tetrofosmin stress test 04/06/2020: No previous exam available for comparison. Lexiscan nuclear stress test performed using 1-day protocol. Stress EKG is non-diagnostic, as this is pharmacological stress test. In addition rest and stress EKG showed sinus rhythm, LBBB. Stress LVEF 53%. SPECT images showed medium sized, medium intensity, partially reversible perfusion defect in apical anteroseptal myocardium.  Intermediate risk study.   EKG 03/25/2020: Sinus rhythm 84 bpm. Left bundle  branch block.  Ventricular trigeminy   Chest Xray 03/24/2020: Heart size within normal limits. Aortic atherosclerosis. No evidence of airspace consolidation within the lungs. No evidence of pleural effusion or pneumothorax. No acute bony abnormality. Thoracic spondylosis.  IMPRESSION: No evidence of acute cardiopulmonary abnormality. Aortic atherosclerosis.  Lower extremity angiography/intervention 01/2017: Angiographic data: Abdominal aorta shows mild atherosclerotic changes. Only the distal abdominal aorta visualized. The right common iliac artery is occluded and fills by collaterals. Right common femoral artery is widely patent. Left iliac artery shows a 40-50% stenosis at ostium, mild disease in the left external iliac artery. Left femoral artery is widely patent.  Successful PTA and stenting of the bilateral common iliac artery with implantation of 9.0 x 37 mm express LD on the right common iliac artery and 9.0 x 25 mm express LD on the left common iliac artery, stenosis reduced from 100% to 0% on the right and 50% to 0% on the left.  Coronary angiogram 2017 [facility]): Severe diffuse triple-vessel coronary disease, small calibered vessels.   Distal circumflex 80%, distal PL branch 80%, LAD 90% stenosis, successful angioplasty with placement of 2.25 x 12, 2.25 x 23, 2.5 x 33 and 2.5 x 8 overlapping  Xience DES.  Mid to distal anteroapical and anterior and inferoapical dyskinesis, LVEF 25%.  Recent labs: 04/01/2020: Chol 129, TG 130, HDL 35, LDL 71  03/24/2020: Glucose 127, BUN/Cr 21/1.41. EGFR 37. Na/K 142/5.2.  H/H 10.7/33.3. MCV 94. Platelets 266   03/24/2020: Glucose 127, BUN/Cr 21/1.41. EGFR 37. Na/K 142/5.2.  H/H 10.7/33.3. MCV 94. Platelets 266 BNP 100    Review of Systems  Cardiovascular: Positive for dyspnea on exertion. Negative for chest pain, leg swelling, palpitations and syncope.  Neurological: Positive for light-headedness.         Vitals:   04/17/20 1036   BP: (!) 130/54  Pulse: 81  Resp: 15  Temp: (!) 95.8 F (35.4 C)  SpO2: 97%   Orthostatic VS for the past 72 hrs (Last 3 readings):  Orthostatic BP Patient Position BP Location Cuff Size Orthostatic Pulse  04/17/20 1056 116/56 Standing Left Arm Large 80  04/17/20 1055 140/62 Sitting Left Arm Large 73  04/17/20 1054 153/69 Supine Left Arm Large 74     Body mass index is 43.65 kg/m. Filed Weights   04/17/20 1036  Weight: 231 lb (104.8 kg)     Objective:   Physical Exam  Constitutional: She appears well-developed and well-nourished.  Neck: No JVD present.  Cardiovascular: Normal rate, regular rhythm, normal heart sounds and intact distal pulses.  No murmur heard. Pulmonary/Chest: Effort normal and breath sounds normal. She has no wheezes. She has no rales.  Musculoskeletal:        General: Edema (Trace b/l) present.  Nursing note and vitals reviewed.         Assessment & Recommendations:   74 year old Caucasian female with hypertension, type 2 diabetes mellitus, coronary artery disease status post overlapping stents in LAD 2019,  ischemic cardiomyopathy with recovered EF 55 to 60% in 2019, PAD status post bilateral iliac stenting 2018, now with lightheadedness, shortness of breath.  Lightheadedness: Improved, although she continues to have significant orthostasis. I have stopped her coreg 6.25 mg bid, and started lower dose beta blocker at metoprolol succinate 25 mg daily.   Exertional dyspnea: Multifactorial, including deconditioning, pulmonary hypertension, mitral regurgitation. 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 I do not think this is secondary to MR. PH was present even when MR was mild. Recommend weight loss with diet and lifestyle modification. Repeat echocardiogram in 6 months.  Ischemic cardiomyopathy: EF recovered on coreg and Entresto. Coreg changed to metoprolol as above.   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 Primary PCI to LAD in [place] in 2017. Continue Aspirin, statin.  Nigel Mormon, MD [facility] Pager: [phone] Office: [phone]

## 2020-04-20 ENCOUNTER — Ambulatory Visit: Admitting: Cardiology

## 2020-04-23 ENCOUNTER — Other Ambulatory Visit: Payer: Self-pay

## 2020-04-23 DIAGNOSIS — I25118 Atherosclerotic heart disease of native coronary artery with other forms of angina pectoris: Secondary | ICD-10-CM

## 2020-04-23 DIAGNOSIS — I255 Ischemic cardiomyopathy: Secondary | ICD-10-CM

## 2020-04-23 MED ORDER — METOPROLOL SUCCINATE ER 25 MG PO TB24: 25.0000 mg | ORAL_TABLET | Freq: Every day | ORAL | 2 refills | Status: DC

## 2020-05-06 DIAGNOSIS — R69 Illness, unspecified: Secondary | ICD-10-CM | POA: Diagnosis not present

## 2020-05-08 DIAGNOSIS — G4733 Obstructive sleep apnea (adult) (pediatric): Secondary | ICD-10-CM | POA: Diagnosis not present

## 2020-05-08 DIAGNOSIS — B351 Tinea unguium: Secondary | ICD-10-CM | POA: Diagnosis not present

## 2020-05-08 DIAGNOSIS — D509 Iron deficiency anemia, unspecified: Secondary | ICD-10-CM | POA: Diagnosis not present

## 2020-05-08 DIAGNOSIS — I2510 Atherosclerotic heart disease of native coronary artery without angina pectoris: Secondary | ICD-10-CM | POA: Diagnosis not present

## 2020-05-08 DIAGNOSIS — E782 Mixed hyperlipidemia: Secondary | ICD-10-CM | POA: Diagnosis not present

## 2020-05-08 DIAGNOSIS — M79674 Pain in right toe(s): Secondary | ICD-10-CM | POA: Diagnosis not present

## 2020-05-08 DIAGNOSIS — E1151 Type 2 diabetes mellitus with diabetic peripheral angiopathy without gangrene: Secondary | ICD-10-CM | POA: Diagnosis not present

## 2020-05-08 DIAGNOSIS — I10 Essential (primary) hypertension: Secondary | ICD-10-CM | POA: Diagnosis not present

## 2020-05-08 DIAGNOSIS — I255 Ischemic cardiomyopathy: Secondary | ICD-10-CM | POA: Diagnosis not present

## 2020-05-08 DIAGNOSIS — I739 Peripheral vascular disease, unspecified: Secondary | ICD-10-CM | POA: Diagnosis not present

## 2020-05-08 DIAGNOSIS — M79675 Pain in left toe(s): Secondary | ICD-10-CM | POA: Diagnosis not present

## 2020-05-08 DIAGNOSIS — G629 Polyneuropathy, unspecified: Secondary | ICD-10-CM | POA: Diagnosis not present

## 2020-06-12 DIAGNOSIS — G4733 Obstructive sleep apnea (adult) (pediatric): Secondary | ICD-10-CM | POA: Diagnosis not present

## 2020-07-08 DIAGNOSIS — R69 Illness, unspecified: Secondary | ICD-10-CM | POA: Diagnosis not present

## 2020-07-10 DIAGNOSIS — R69 Illness, unspecified: Secondary | ICD-10-CM | POA: Diagnosis not present

## 2020-08-28 DIAGNOSIS — M79675 Pain in left toe(s): Secondary | ICD-10-CM | POA: Diagnosis not present

## 2020-08-28 DIAGNOSIS — M79674 Pain in right toe(s): Secondary | ICD-10-CM | POA: Diagnosis not present

## 2020-08-28 DIAGNOSIS — E1151 Type 2 diabetes mellitus with diabetic peripheral angiopathy without gangrene: Secondary | ICD-10-CM | POA: Diagnosis not present

## 2020-08-28 DIAGNOSIS — B351 Tinea unguium: Secondary | ICD-10-CM | POA: Diagnosis not present

## 2020-09-21 DIAGNOSIS — E1151 Type 2 diabetes mellitus with diabetic peripheral angiopathy without gangrene: Secondary | ICD-10-CM | POA: Diagnosis not present

## 2020-09-21 DIAGNOSIS — I255 Ischemic cardiomyopathy: Secondary | ICD-10-CM | POA: Diagnosis not present

## 2020-09-21 DIAGNOSIS — I739 Peripheral vascular disease, unspecified: Secondary | ICD-10-CM | POA: Diagnosis not present

## 2020-09-21 DIAGNOSIS — E782 Mixed hyperlipidemia: Secondary | ICD-10-CM | POA: Diagnosis not present

## 2020-09-21 DIAGNOSIS — I2510 Atherosclerotic heart disease of native coronary artery without angina pectoris: Secondary | ICD-10-CM | POA: Diagnosis not present

## 2020-09-21 DIAGNOSIS — R0602 Shortness of breath: Secondary | ICD-10-CM | POA: Diagnosis not present

## 2020-09-21 DIAGNOSIS — I10 Essential (primary) hypertension: Secondary | ICD-10-CM | POA: Diagnosis not present

## 2020-09-24 DIAGNOSIS — Z1231 Encounter for screening mammogram for malignant neoplasm of breast: Secondary | ICD-10-CM | POA: Diagnosis not present

## 2020-09-25 DIAGNOSIS — I255 Ischemic cardiomyopathy: Secondary | ICD-10-CM | POA: Diagnosis not present

## 2020-09-25 DIAGNOSIS — I2510 Atherosclerotic heart disease of native coronary artery without angina pectoris: Secondary | ICD-10-CM | POA: Diagnosis not present

## 2020-09-25 DIAGNOSIS — I10 Essential (primary) hypertension: Secondary | ICD-10-CM | POA: Diagnosis not present

## 2020-09-25 DIAGNOSIS — I5021 Acute systolic (congestive) heart failure: Secondary | ICD-10-CM | POA: Diagnosis not present

## 2020-10-02 DIAGNOSIS — G4733 Obstructive sleep apnea (adult) (pediatric): Secondary | ICD-10-CM | POA: Diagnosis not present

## 2020-10-12 ENCOUNTER — Other Ambulatory Visit: Payer: Medicare HMO

## 2020-10-19 ENCOUNTER — Ambulatory Visit: Payer: Medicare HMO | Admitting: Cardiology

## 2020-10-19 ENCOUNTER — Other Ambulatory Visit: Payer: Medicare HMO

## 2020-10-19 ENCOUNTER — Other Ambulatory Visit: Payer: Self-pay

## 2020-10-19 ENCOUNTER — Ambulatory Visit: Payer: Medicare HMO

## 2020-10-19 DIAGNOSIS — I340 Nonrheumatic mitral (valve) insufficiency: Secondary | ICD-10-CM

## 2020-10-19 DIAGNOSIS — I25118 Atherosclerotic heart disease of native coronary artery with other forms of angina pectoris: Secondary | ICD-10-CM | POA: Diagnosis not present

## 2020-10-19 DIAGNOSIS — I272 Pulmonary hypertension, unspecified: Secondary | ICD-10-CM

## 2020-10-26 ENCOUNTER — Encounter: Payer: Self-pay | Admitting: Cardiology

## 2020-10-26 ENCOUNTER — Ambulatory Visit: Payer: Medicare HMO | Admitting: Cardiology

## 2020-10-26 ENCOUNTER — Other Ambulatory Visit: Payer: Self-pay

## 2020-10-26 VITALS — BP 128/70 | HR 76 | Resp 16 | Ht 61.0 in | Wt 228.8 lb

## 2020-10-26 DIAGNOSIS — R0609 Other forms of dyspnea: Secondary | ICD-10-CM

## 2020-10-26 DIAGNOSIS — R0600 Dyspnea, unspecified: Secondary | ICD-10-CM

## 2020-10-26 DIAGNOSIS — I272 Pulmonary hypertension, unspecified: Secondary | ICD-10-CM | POA: Diagnosis not present

## 2020-10-26 DIAGNOSIS — I25118 Atherosclerotic heart disease of native coronary artery with other forms of angina pectoris: Secondary | ICD-10-CM | POA: Diagnosis not present

## 2020-10-26 NOTE — Progress Notes (Signed)
Follow up visit  Subjective:   Christine Levy, female    DOB: Feb 18, 1946, 74 y.o.   MRN: [id number]   HPI   Chief Complaint  Patient presents with  . Coronary Artery Disease  . Follow-up    45 month    74 year old Caucasian female with hypertension, type 2 diabetes mellitus, coronary artery disease status post overlapping stents in LAD 2019, ischemic cardiomyopathy with recovered EF 55 to 60% in 2019, PAD status post bilateral iliac stenting 2018.  Patient is doing well, denies chest pain, shortness of breath, palpitations, leg edema, orthopnea, PND, TIA/syncope, claudication.  Reviewed echocardiogram results with the patient, details below.   OV 02/2020: Patient spends 6 months in [place], 6 months in [place].  She was last seen by Dr. Einar Gip in 2019.  Recently, patient has experienced complains of lightheadedness.  She has also experienced exertional dyspnea with minimal activity.  She denies any chest pain.  That said, patient did not have any chest pain when she had her MI.  She describes her shortness of breath is not as worse as it was during her MI in 2017.  Recently, she stopped her diuretic chlorthalidone due to symptoms of lightheadedness.  However, she gained 5 pounds weight in 2 days.  Patient was seen in [facility] long hospital emergency department on 03/24/2020.  Her work-up showed mildly elevated potassium at 5.2.    Current Outpatient Medications on File Prior to Visit  Medication Sig Dispense Refill  . aspirin EC 81 MG tablet Take 81 mg by mouth daily.    Marland Kitchen atorvastatin (LIPITOR) 40 MG tablet Take 40 mg by mouth in the morning and at bedtime.    . carvedilol (COREG) 12.5 MG tablet Take 12.5 mg by mouth 2 (two) times daily with a meal.    . chlorthalidone (HYGROTON) 25 MG tablet Take 1 tablet (25 mg total) by mouth every morning. 30 tablet 0  . cyanocobalamin 2000 MCG tablet Take 2,000 mcg by mouth daily.    . Dulaglutide (TRULICITY) 1.5 HY/0.7PX SOPN Inject 1.5 mg into  the skin every 7 (seven) days.    . Folic Acid (FOLATE PO) Take 15 mg by mouth daily.    . insulin glargine (LANTUS) 100 UNIT/ML injection Inject 30 Units into the skin 2 (two) times daily.    . nitroGLYCERIN (NITROSTAT) 0.4 MG SL tablet Place 0.4 mg under the tongue every 5 (five) minutes as needed for chest pain.    Marland Kitchen PARoxetine (PAXIL) 10 MG tablet Take 10 mg by mouth daily.    . Probiotic Product (PROBIOTIC PO) Take 1 tablet by mouth daily.    Marland Kitchen rOPINIRole (REQUIP) 2 MG tablet Take 2 mg by mouth See admin instructions. Take 79m tablet by mouth in the morning then take 2.526min the evening    . sacubitril-valsartan (ENTRESTO) 49-51 MG Take 1 tablet by mouth 2 (two) times daily.     No current facility-administered medications on file prior to visit.    Cardiovascular & other pertient studies:  EKG 10/26/2020: Sinus rhythm 78 bpm Frequent ectopic ventricular beat Left bundle branch block  Echocardiogram 10/19/2020:  Normal LV systolic function with visual EF 55-60%. Left ventricle cavity  is normal in size. Mild concentric hypertrophy of the left ventricle.  Normal global wall motion. Normal diastolic filling pattern.  Mild (Grade I) mitral regurgitation.  Mild pulmonary hypertension. RVSP measures 42 mmHg.  IVC is normal with a respiratory response of <50%.  Compared to prior study dated  04/06/2020: TR and MR was moderate and now  MR is mild and TR is trace. LVEF remains stable.  Lexiscan Tetrofosmin stress test 04/06/2020: No previous exam available for comparison. Lexiscan nuclear stress test performed using 1-day protocol. Stress EKG is non-diagnostic, as this is pharmacological stress test. In addition rest and stress EKG showed sinus rhythm, LBBB. Stress LVEF 53%. SPECT images showed medium sized, medium intensity, partially reversible perfusion defect in apical anteroseptal myocardium.  Intermediate risk study.   Chest Xray 03/24/2020: Heart size within normal limits. Aortic  atherosclerosis. No evidence of airspace consolidation within the lungs. No evidence of pleural effusion or pneumothorax. No acute bony abnormality. Thoracic spondylosis.  IMPRESSION: No evidence of acute cardiopulmonary abnormality. Aortic atherosclerosis.  Lower extremity angiography/intervention 01/2017: Angiographic data: Abdominal aorta shows mild atherosclerotic changes. Only the distal abdominal aorta visualized. The right common iliac artery is occluded and fills by collaterals. Right common femoral artery is widely patent. Left iliac artery shows a 40-50% stenosis at ostium, mild disease in the left external iliac artery. Left femoral artery is widely patent.  Successful PTA and stenting of the bilateral common iliac artery with implantation of 9.0 x 37 mm express LD on the right common iliac artery and 9.0 x 25 mm express LD on the left common iliac artery, stenosis reduced from 100% to 0% on the right and 50% to 0% on the left.  Coronary angiogram 2017 [facility]): Severe diffuse triple-vessel coronary disease, small calibered vessels.   Distal circumflex 80%, distal PL branch 80%, LAD 90% stenosis, successful angioplasty with placement of 2.25 x 12, 2.25 x 23, 2.5 x 33 and 2.5 x 8 overlapping  Xience DES.  Mid to distal anteroapical and anterior and inferoapical dyskinesis, LVEF 25%.  Recent labs: 04/01/2020: Chol 129, TG 130, HDL 35, LDL 71  03/24/2020: Glucose 127, BUN/Cr 21/1.41. EGFR 37. Na/K 142/5.2.  H/H 10.7/33.3. MCV 94. Platelets 266   03/24/2020: Glucose 127, BUN/Cr 21/1.41. EGFR 37. Na/K 142/5.2.  H/H 10.7/33.3. MCV 94. Platelets 266 BNP 100    Review of Systems  Cardiovascular: Positive for dyspnea on exertion. Negative for chest pain, leg swelling, palpitations and syncope.  Neurological: Positive for light-headedness.         Vitals:   10/26/20 1323  BP: 128/70  Pulse: 76  Resp: 16  SpO2: 99%     Body mass index is 43.23 kg/m.   Objective:    Physical Exam Vitals and nursing note reviewed.  Constitutional:      Appearance: She is well-developed.  Neck:     Vascular: No JVD.  Cardiovascular:     Rate and Rhythm: Normal rate and regular rhythm.     Pulses: Intact distal pulses.     Heart sounds: Normal heart sounds. No murmur heard.   Pulmonary:     Effort: Pulmonary effort is normal.     Breath sounds: Normal breath sounds. No wheezing or rales.           Assessment & Recommendations:   74 year old Caucasian female with hypertension, type 2 diabetes mellitus, coronary artery disease status post overlapping stents in LAD 2019, ischemic cardiomyopathy with recovered EF 55 to 60% in 2019, PAD status post bilateral iliac stenting 2018,   Ischemic cardiomyopathy: EF recovered on coreg and Entresto. BP, HR controlled without beta blocker, without any heart failure s/s. Ok to stay off.    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 Primary PCI to LAD in [place] in 2017. Continue  Aspirin, statin.  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 [place] Pager: [phone] Office: [phone]

## 2020-11-14 ENCOUNTER — Emergency Department (HOSPITAL_COMMUNITY)
Admission: EM | Admit: 2020-11-14 | Discharge: 2020-11-15 | Disposition: A | Discharge status: Home / Self Care | Payer: Medicare HMO | Attending: Emergency Medicine | Admitting: Emergency Medicine

## 2020-11-14 ENCOUNTER — Other Ambulatory Visit: Payer: Self-pay

## 2020-11-14 ENCOUNTER — Encounter (HOSPITAL_COMMUNITY): Payer: Self-pay | Admitting: *Deleted

## 2020-11-14 ENCOUNTER — Emergency Department (HOSPITAL_COMMUNITY): Payer: Medicare HMO

## 2020-11-14 DIAGNOSIS — Z794 Long term (current) use of insulin: Secondary | ICD-10-CM | POA: Diagnosis not present

## 2020-11-14 DIAGNOSIS — I5033 Acute on chronic diastolic (congestive) heart failure: Secondary | ICD-10-CM | POA: Diagnosis not present

## 2020-11-14 DIAGNOSIS — R918 Other nonspecific abnormal finding of lung field: Secondary | ICD-10-CM | POA: Diagnosis not present

## 2020-11-14 DIAGNOSIS — J810 Acute pulmonary edema: Secondary | ICD-10-CM | POA: Diagnosis not present

## 2020-11-14 DIAGNOSIS — R0602 Shortness of breath: Secondary | ICD-10-CM | POA: Diagnosis not present

## 2020-11-14 DIAGNOSIS — I110 Hypertensive heart disease with heart failure: Secondary | ICD-10-CM | POA: Diagnosis not present

## 2020-11-14 DIAGNOSIS — Z7982 Long term (current) use of aspirin: Secondary | ICD-10-CM | POA: Diagnosis not present

## 2020-11-14 DIAGNOSIS — E1140 Type 2 diabetes mellitus with diabetic neuropathy, unspecified: Secondary | ICD-10-CM | POA: Insufficient documentation

## 2020-11-14 DIAGNOSIS — Z79899 Other long term (current) drug therapy: Secondary | ICD-10-CM | POA: Diagnosis not present

## 2020-11-14 DIAGNOSIS — Z87891 Personal history of nicotine dependence: Secondary | ICD-10-CM | POA: Insufficient documentation

## 2020-11-14 LAB — CBC
HCT: 32.2 % — ABNORMAL LOW (ref 36.0–46.0)
Hemoglobin: 10.4 g/dL — ABNORMAL LOW (ref 12.0–15.0)
MCH: 30.1 pg (ref 26.0–34.0)
MCHC: 32.3 g/dL (ref 30.0–36.0)
MCV: 93.1 fL (ref 80.0–100.0)
Platelets: 322 10*3/uL (ref 150–400)
RBC: 3.46 MIL/uL — ABNORMAL LOW (ref 3.87–5.11)
RDW: 13.2 % (ref 11.5–15.5)
WBC: 8.1 10*3/uL (ref 4.0–10.5)
nRBC: 0 % (ref 0.0–0.2)

## 2020-11-14 LAB — BASIC METABOLIC PANEL
Anion gap: 10 (ref 5–15)
BUN: 22 mg/dL (ref 8–23)
CO2: 22 mmol/L (ref 22–32)
Calcium: 8.4 mg/dL — ABNORMAL LOW (ref 8.9–10.3)
Chloride: 107 mmol/L (ref 98–111)
Creatinine, Ser: 1.06 mg/dL — ABNORMAL HIGH (ref 0.44–1.00)
GFR, Estimated: 55 mL/min — ABNORMAL LOW (ref >60)
Glucose, Bld: 288 mg/dL — ABNORMAL HIGH (ref 70–99)
Potassium: 3.8 mmol/L (ref 3.5–5.1)
Sodium: 139 mmol/L (ref 135–145)

## 2020-11-14 LAB — TROPONIN I (HIGH SENSITIVITY): Troponin I (High Sensitivity): 20 ng/L — ABNORMAL HIGH (ref <18)

## 2020-11-14 NOTE — ED Triage Notes (Signed)
The pt is c/o sob tonight no chest pain

## 2020-11-15 ENCOUNTER — Other Ambulatory Visit: Payer: Self-pay

## 2020-11-15 DIAGNOSIS — R0602 Shortness of breath: Secondary | ICD-10-CM | POA: Diagnosis not present

## 2020-11-15 DIAGNOSIS — J810 Acute pulmonary edema: Secondary | ICD-10-CM | POA: Diagnosis not present

## 2020-11-15 LAB — BRAIN NATRIURETIC PEPTIDE: B Natriuretic Peptide: 322 pg/mL — ABNORMAL HIGH (ref 0.0–100.0)

## 2020-11-15 LAB — TROPONIN I (HIGH SENSITIVITY)
Troponin I (High Sensitivity): 35 ng/L — ABNORMAL HIGH (ref <18)
Troponin I (High Sensitivity): 36 ng/L — ABNORMAL HIGH (ref <18)

## 2020-11-15 MED ORDER — FUROSEMIDE 20 MG PO TABS
80.0000 mg | ORAL_TABLET | Freq: Once | ORAL | Status: AC
  Administered 2020-11-15: 80 mg via ORAL
  Filled 2020-11-15: qty 4

## 2020-11-15 MED ORDER — POTASSIUM CHLORIDE CRYS ER 20 MEQ PO TBCR
40.0000 meq | EXTENDED_RELEASE_TABLET | Freq: Once | ORAL | Status: AC
  Administered 2020-11-15: 40 meq via ORAL
  Filled 2020-11-15: qty 2

## 2020-11-15 NOTE — ED Provider Notes (Signed)
[facility] EMERGENCY DEPARTMENT Provider Note   CSN: [id number] Arrival date & time: 11/14/20  2206     History Chief Complaint  Patient presents with  . Shortness of Breath    Christine Levy is a 74 y.o. female.  Patient to ED for evaluation of SOB that has been coming on over 3 days. She noticed it while lying on the cough at rest, but denies orthopnea. She has also noticed SOB with exertion. No cough or fever. No chest pain, nausea or vomiting. She has a history of DM, sCHF, HTN, previous MI that presented with painless SOB in the past requiring stents x 4. No diaphoresis. No LE edema.   The history is provided by the patient. No language interpreter was used.       Past Medical History:  Diagnosis Date  . Acute respiratory failure (HCC)   . Cardiomyopathy (HCC)   . Hypertension   . MI (myocardial infarction) (HCC)   . Systolic heart failure [facility])     Patient Active Problem List   Diagnosis Date Noted  . Pulmonary hypertension, unspecified (HCC) 04/17/2020  . Nonrheumatic mitral valve regurgitation 04/17/2020  . Exertional dyspnea 03/25/2020  . Orthostatic hypotension 03/25/2020  . Hyperkalemia 03/25/2020  . Acute on chronic diastolic CHF (congestive heart failure) (HCC)   . Acute respiratory failure (HCC) 01/03/2018  . Pleural effusion 01/03/2018  . DM type 2 with diabetic peripheral neuropathy (HCC) 10/30/2017  . Onychomycosis of great toe 10/04/2017  . Peripheral arterial disease (HCC) 02/20/2017  . Coronary artery disease 02/20/2017  . Ischemic cardiomyopathy 02/20/2017  . Acute heart failure (HCC) 10/12/2016  . Acute on chronic systolic congestive heart failure (HCC) 09/29/2016  . STEMI (ST elevation myocardial infarction) (HCC) 09/29/2016  . Depression 10/27/2015  . Essential hypertension 10/27/2015  . Fatigue 10/27/2015  . Inflammation of eyelid 10/27/2015  . Mixed hyperlipidemia 10/27/2015  . Obstructive sleep apnea syndrome  10/27/2015  . Osteopenia 10/27/2015  . Overactive bladder 10/27/2015  . Restless legs syndrome 10/27/2015  . Right upper quadrant abdominal pain 10/27/2015  . Severe obesity (BMI 35.0-39.9) with comorbidity (HCC) 10/27/2015  . Sleep apnea 10/27/2015  . Type 2 diabetes mellitus with diabetic peripheral angiopathy without gangrene (HCC) 10/27/2015    Past Surgical History:  Procedure Laterality Date  . ABDOMINAL AORTOGRAM N/A 02/21/2017   Procedure: Abdominal Aortogram;  Surgeon: Yates Decamp, MD;  Location: [facility] INVASIVE CV LAB;  Service: Cardiovascular;  Laterality: N/A;  . LOWER EXTREMITY ANGIOGRAPHY N/A 02/21/2017   Procedure: Lower Extremity Angiography;  Surgeon: Yates Decamp, MD;  Location: [facility] INVASIVE CV LAB;  Service: Cardiovascular;  Laterality: N/A;  . PERIPHERAL VASCULAR INTERVENTION  02/21/2017   Procedure: Peripheral Vascular Intervention;  Surgeon: Yates Decamp, MD;  Location: [facility] INVASIVE CV LAB;  Service: Cardiovascular;;     OB History   No obstetric history on file.     Family History  Problem Relation Age of Onset  . Hypertension Mother   . Heart attack Mother   . Heart attack Father     Social History   Tobacco Use  . Smoking status: Former Smoker    Packs/day: 1.50    Years: 23.00    Pack years: 34.50    Types: Cigarettes    Quit date: 1991    Years since quitting: 30.9  . Smokeless tobacco: Never Used  Vaping Use  . Vaping Use: Never used  Substance Use Topics  . Alcohol use: Never  . Drug use: Never  Home Medications Prior to Admission medications   Medication Sig Start Date End Date Taking? Authorizing Provider  aspirin EC 81 MG tablet Take 81 mg by mouth daily.   Yes [provider]  atorvastatin (LIPITOR) 40 MG tablet Take 80 mg by mouth every evening.    Yes [provider]  carvedilol (COREG) 12.5 MG tablet Take 6.25 mg by mouth 2 (two) times daily with a meal.   Yes [provider]  chlorthalidone (HYGROTON) 25 MG  tablet Take 1 tablet (25 mg total) by mouth every morning. 01/06/18  Yes Maxie Barb, MD  Dulaglutide (TRULICITY) 1.5 MG/0.5ML SOPN Inject 1.5 mg into the skin every 7 (seven) days. 09/04/17  Yes [provider]  insulin glargine (LANTUS) 100 UNIT/ML injection Inject 20 Units into the skin 2 (two) times daily.    Yes [provider]  l-methylfolate-B6-B12 (METANX) 3-35-2 MG TABS tablet Take 1 tablet by mouth daily.   Yes [provider]  nitroGLYCERIN (NITROSTAT) 0.4 MG SL tablet Place 0.4 mg under the tongue every 5 (five) minutes as needed for chest pain.   Yes [provider]  PARoxetine (PAXIL) 10 MG tablet Take 10 mg by mouth daily.   Yes [provider]  Probiotic Product (PROBIOTIC PO) Take 1 tablet by mouth daily.   Yes [provider]  rOPINIRole (REQUIP) 2 MG tablet Take 3 mg by mouth at bedtime.    Yes [provider]  sacubitril-valsartan (ENTRESTO) 49-51 MG Take 1 tablet by mouth 2 (two) times daily.   Yes [provider]    Allergies    Penicillins, Percodan [oxycodone-aspirin], Sulfa antibiotics, and Epinephrine  Review of Systems   Review of Systems  Constitutional: Negative for chills and fever.  HENT: Negative.   Eyes: Negative for visual disturbance.  Respiratory: Positive for shortness of breath.   Cardiovascular: Negative.  Negative for chest pain and leg swelling.  Gastrointestinal: Negative.  Negative for vomiting.  Musculoskeletal: Negative.   Skin: Negative.   Neurological: Negative.  Negative for weakness and light-headedness.  Psychiatric/Behavioral: Negative for confusion.    Physical Exam Updated Vital Signs BP (!) 153/58   Pulse 84   Temp 98.6 F (37 C) (Oral)   Resp 18   Ht 5\' 1"  (1.549 m)   Wt 99.8 kg   SpO2 98%   BMI 41.57 kg/m   Physical Exam Vitals and nursing note reviewed.  Constitutional:      Appearance: She is well-developed.  HENT:     Head:  Normocephalic.  Cardiovascular:     Rate and Rhythm: Normal rate and regular rhythm.     Heart sounds: No murmur heard.   Pulmonary:     Effort: Pulmonary effort is normal.     Breath sounds: Normal breath sounds. No wheezing, rhonchi or rales.  Abdominal:     General: Bowel sounds are normal.     Palpations: Abdomen is soft.     Tenderness: There is no abdominal tenderness. There is no guarding or rebound.  Musculoskeletal:        General: Normal range of motion.     Cervical back: Normal range of motion and neck supple.     Right lower leg: No edema.     Left lower leg: No edema.  Skin:    General: Skin is warm and dry.     Findings: No rash.  Neurological:     Mental Status: She is alert and oriented to person, place, and  time.     ED Results / Procedures / Treatments   Labs (all labs ordered are listed, but only abnormal results are displayed) Labs Reviewed  BASIC METABOLIC PANEL - Abnormal; Notable for the following components:      Result Value   Glucose, Bld 288 (*)    Creatinine, Ser 1.06 (*)    Calcium 8.4 (*)    GFR, Estimated 55 (*)    All other components within normal limits  CBC - Abnormal; Notable for the following components:   RBC 3.46 (*)    Hemoglobin 10.4 (*)    HCT 32.2 (*)    All other components within normal limits  BRAIN NATRIURETIC PEPTIDE - Abnormal; Notable for the following components:   B Natriuretic Peptide 322.0 (*)    All other components within normal limits  TROPONIN I (HIGH SENSITIVITY) - Abnormal; Notable for the following components:   Troponin I (High Sensitivity) 20 (*)    All other components within normal limits  TROPONIN I (HIGH SENSITIVITY) - Abnormal; Notable for the following components:   Troponin I (High Sensitivity) 35 (*)    All other components within normal limits  TROPONIN I (HIGH SENSITIVITY) - Abnormal; Notable for the following components:   Troponin I (High Sensitivity) 36 (*)    All other components within  normal limits    EKG EKG Interpretation  Date/Time:  Saturday November 14 2020 22:22:49 EST Ventricular Rate:  97 PR Interval:  130 QRS Duration: 138 QT Interval:  392 QTC Calculation: 497 R Axis:   74 Text Interpretation: Sinus rhythm with frequent Premature ventricular complexes Left ventricular hypertrophy with QRS widening and repolarization abnormality ( Cornell product ) Cannot rule out Septal infarct , age undetermined Abnormal ECG No significant change since last tracing Confirmed by Marily Memos [phone]) on 11/15/2020 3:16:10 AM   Radiology DG Chest 2 View  Result Date: 11/14/2020 CLINICAL DATA:  Shortness of breath EXAM: CHEST - 2 VIEW COMPARISON:  03/24/2020 FINDINGS: Diffusely increased interstitial opacity. Borderline cardiomegaly. No consolidation or effusion. Aortic atherosclerosis. No pneumothorax. IMPRESSION: Diffusely increased interstitial opacity, favored to represent interstitial pulmonary edema over diffuse interstitial inflammatory process/atypical infection. Electronically Signed   By: Jasmine Pang M.D.   On: 11/14/2020 22:50    Procedures Procedures (including critical care time)  Medications Ordered in ED Medications  furosemide (LASIX) tablet 80 mg (80 mg Oral Given 11/15/20 0350)  potassium chloride SA (KLOR-CON) CR tablet 40 mEq (40 mEq Oral Given 11/15/20 0352)    ED Course  I have reviewed the triage vital signs and the nursing notes.  Pertinent labs & imaging results that were available during my care of the patient were reviewed by me and considered in my medical decision making (see chart for details).    MDM Rules/Calculators/A&P                          Patient to ED with SOB without chest pain. Comfortable at present. No fever or cough.   Very well appearing patient in NAD. VSS. Labs pending. Currently asymptomatic.   EKG without concerning change. Initial troponin minimally elevated at 20. Delta pending. CXR with signs of mild  interstitial edema. No peripheral edema.   Delta trop elevated at 35. The patient is seen by Dr. Clayborne Dana. Additional labs ordered (BNP, 3rd trop) and patient is given Lasix 80 mg PO. Will re-evaluate after labs resulted.   No further uptrending of troponin. BNP minimally  elevated at 322. She is diuresing after Lasix.   She is felt appropriate for discharge home. Will increase her Chlorthalidone from 25 mg to 50 mg for the next 4 days. She is to have close follow up with her PCP in one week.   Final Clinical Impression(s) / ED Diagnoses Final diagnoses:  None   1. Pulmonary Edema  Rx / DC Orders ED Discharge Orders    None       Elpidio AnisUpstill, Tarvaris Puglia, PA-C 11/15/20 0536    Palumbo, April, MD 11/15/20 [phone]

## 2020-11-15 NOTE — Discharge Instructions (Signed)
Increase your chlorthalidone from 1 pill to 2 pills daily for the next 4 days. Follow up with your doctor for recheck in one week.   Return to the emergency department with any new or worsening symptoms.

## 2020-11-15 NOTE — ED Notes (Signed)
Patient verbalizes understanding of discharge instructions. Opportunity for questioning and answers were provided. Armband removed by staff, pt discharged from ED ambulatory.   

## 2020-12-08 ENCOUNTER — Other Ambulatory Visit: Payer: Self-pay

## 2020-12-08 ENCOUNTER — Ambulatory Visit (INDEPENDENT_AMBULATORY_CARE_PROVIDER_SITE_OTHER): Payer: Medicare HMO | Admitting: Podiatry

## 2020-12-08 DIAGNOSIS — B351 Tinea unguium: Secondary | ICD-10-CM | POA: Diagnosis not present

## 2020-12-08 DIAGNOSIS — E1142 Type 2 diabetes mellitus with diabetic polyneuropathy: Secondary | ICD-10-CM

## 2020-12-08 NOTE — Progress Notes (Signed)
  Subjective:  Patient ID: Christine Levy, female    DOB: 1946/10/13,  MRN: [id number]  Chief Complaint  Patient presents with  . Nail Problem    Nail trim 1-5 bilateral    74 y.o. female presents with the above complaint. History confirmed with patient.   Objective:  Physical Exam: warm, good capillary refill, nail exam onychomycosis of the toenails, no trophic changes or ulcerative lesions. DP pulses palpable and PT pulses palpable. Subjective paresthesias. Left Foot: normal exam, no swelling, tenderness, instability; ligaments intact, full range of motion of all ankle/foot joints  Right Foot: normal exam, no swelling, tenderness, instability; ligaments intact, full range of motion of all ankle/foot joints   No images are attached to the encounter.  Assessment:   1. DM type 2 with diabetic peripheral neuropathy (HCC)   2. Onychomycosis    Plan:  Patient was evaluated and treated and all questions answered.  Onychomycosis, Diabetes and DPN -Patient is diabetic with a qualifying condition for at risk foot care.  Procedure: Nail Debridement Type of Debridement: manual, sharp debridement. Instrumentation: Nail nipper, rotary burr. Number of Nails: 10  Return in about 3 months (around 03/08/2021) for Diabetic Foot Care.

## 2020-12-15 ENCOUNTER — Telehealth: Payer: Self-pay | Admitting: Cardiology

## 2020-12-15 NOTE — Telephone Encounter (Signed)
Patient Christine Levy called and advised that she needs to know if she is suppose to be continuing her Metropolol , Patient states she left a voicemail . Please call her back to confirm with patient at [phone] . Thank you

## 2020-12-17 NOTE — Telephone Encounter (Signed)
If not currently taking, no need to start. If taking, okay to continue.  Thanks MJP

## 2021-01-02 DIAGNOSIS — M7740 Metatarsalgia, unspecified foot: Secondary | ICD-10-CM | POA: Diagnosis not present

## 2021-01-06 DIAGNOSIS — R69 Illness, unspecified: Secondary | ICD-10-CM | POA: Diagnosis not present

## 2021-01-12 DIAGNOSIS — G4733 Obstructive sleep apnea (adult) (pediatric): Secondary | ICD-10-CM | POA: Diagnosis not present

## 2021-04-02 ENCOUNTER — Other Ambulatory Visit: Payer: Self-pay

## 2021-04-02 ENCOUNTER — Ambulatory Visit (INDEPENDENT_AMBULATORY_CARE_PROVIDER_SITE_OTHER): Payer: Medicare HMO | Admitting: Podiatry

## 2021-04-02 DIAGNOSIS — E1142 Type 2 diabetes mellitus with diabetic polyneuropathy: Secondary | ICD-10-CM

## 2021-04-02 DIAGNOSIS — B351 Tinea unguium: Secondary | ICD-10-CM

## 2021-04-02 MED ORDER — METANX 3-90.314-2-35 MG PO CAPS: 2.0000 | ORAL_CAPSULE | Freq: Every day | ORAL | 2 refills | Status: DC

## 2021-04-02 NOTE — Progress Notes (Addendum)
  Subjective:  Patient ID: Suleika Donavan, female    DOB: 01/02/1946,  MRN: [id number]  Chief Complaint  Patient presents with  . Nail Problem    Thick painful toenails, 3 month follow up  . Diabetes   75 y.o. female presents with the above complaint. History confirmed with patient.   Objective:  Physical Exam: warm, good capillary refill, nail exam onychomycosis of the toenails, no trophic changes or ulcerative lesions. DP pulses palpable and PT pulses palpable. Subjective paresthesias. Hammertoe formation bilaterally of lesser digits.  No images are attached to the encounter.  Assessment:   1. DM type 2 with diabetic peripheral neuropathy (HCC)   2. Onychomycosis    Plan:  Patient was evaluated and treated and all questions answered.  Onychomycosis, Diabetes and DPN -Patient is diabetic with a qualifying condition for at risk foot care. -Would benefit from DM shoes given DPN and HT formation  Procedure: Nail Debridement Type of Debridement: manual, sharp debridement. Instrumentation: Nail nipper, rotary burr. Number of Nails: 10  No follow-ups on file.

## 2021-04-20 ENCOUNTER — Telehealth: Payer: Self-pay | Admitting: *Deleted

## 2021-04-20 NOTE — Telephone Encounter (Signed)
Patient is calling because the prescription refill for Matanx is not available at her pharmacy. What other places can she get it?  She is also wanting to let the doctor know that her insurance has denied coverage for her orthotic purchase. Could he write a letter?Please advise.

## 2021-04-29 ENCOUNTER — Other Ambulatory Visit: Payer: Self-pay | Admitting: Podiatry

## 2021-04-29 MED ORDER — L-METHYLFOLATE-B6-B12 3-35-2 MG PO TABS
1.0000 | ORAL_TABLET | Freq: Every day | ORAL | 2 refills | Status: DC
Start: 2021-04-29 — End: 2021-04-30

## 2021-04-29 NOTE — Telephone Encounter (Signed)
Pt called back and stated that she needs a letter for reimbursement for this medication that was sent. She said its not covered by insurance. She wants to send the letter to her with the provider number and diagnosis.  Patient needs a printed prescription to take with her to [facility] so that she can get it filled at a pharmacy there, since she is going to be gone for a few months.

## 2021-04-29 NOTE — Telephone Encounter (Signed)
Called and spoke to patient sent med to different pharmacy and wrote LMN.

## 2021-04-29 NOTE — Telephone Encounter (Signed)
Pt has called back and stated that [place] [facility]. She would like an alternative. Also she wanted to know did Dr. Samuella Cota write her letter for her denied orthotic purchase.  Please Advise

## 2021-04-29 NOTE — Telephone Encounter (Signed)
Will handle tomorrow when I'm in [place]

## 2021-04-30 ENCOUNTER — Telehealth: Payer: Self-pay | Admitting: Podiatry

## 2021-04-30 ENCOUNTER — Other Ambulatory Visit: Payer: Self-pay | Admitting: Podiatry

## 2021-04-30 MED ORDER — L-METHYLFOLATE-B6-B12 3-35-2 MG PO TABS
1.0000 | ORAL_TABLET | Freq: Every day | ORAL | 2 refills | Status: DC
Start: 2021-04-30 — End: 2022-02-10

## 2021-04-30 NOTE — Telephone Encounter (Signed)
Left message correcting  myself as the rx is for metanx.

## 2021-04-30 NOTE — Telephone Encounter (Signed)
Left message for pt that rx is ready for pick up for the orthotics and to call if any questions.

## 2021-05-10 NOTE — Telephone Encounter (Signed)
Patient calling to request a written prescription for mediation to treat neuropathy. Patient will be going to [place] for a few months and does not know which pharmacy she ill use when she gets there. Please advise.

## 2021-05-17 ENCOUNTER — Telehealth: Payer: Self-pay | Admitting: Podiatry

## 2021-05-17 NOTE — Telephone Encounter (Signed)
Received message from Alvarado from [place] [employer] on Friday stating pt contacted them about diabetic inserts but did not have the cpt code. He asked me to call pt and give her the information.  I called pt and left message with the cpt for diabetic inserts and shoes ([phone]) and to call if any further questions.

## 2021-05-19 NOTE — Telephone Encounter (Signed)
Patient is still waiting for written prescription to be sent to her home. Patient will be leaving for [place] in a week and states she has not received any feedback on the medication. Please advise.

## 2021-05-20 ENCOUNTER — Other Ambulatory Visit: Payer: Self-pay | Admitting: Podiatry

## 2021-05-20 MED ORDER — METANX 3-90.314-2-35 MG PO CAPS
2.0000 | ORAL_CAPSULE | Freq: Every day | ORAL | 2 refills | Status: DC
Start: 2021-05-20 — End: 2022-02-10

## 2021-05-20 NOTE — Telephone Encounter (Signed)
I re-printed Rx - there should be one available for pickup up front already but just in case I printed a new one if someone could get it off the printer on the side near Dr. Charlsie Merles and Ardelle Anton

## 2021-05-20 NOTE — Telephone Encounter (Signed)
I'm not sure that we can do that. However if she finds a pharmacy near her she would like to have it filled at we can always send a new Rx electronically or by phone.

## 2021-05-20 NOTE — Telephone Encounter (Signed)
The patient is not willing to make a trip to the office to pick up the print out Rx. She was under the impression it would be mailed to her. Is there  a way to get it mailed to her?

## 2021-05-20 NOTE — Telephone Encounter (Signed)
So once the patient moves states, we will be able to call her pharmacy in [place] and have the medication sent electronically there?

## 2021-06-04 DIAGNOSIS — I739 Peripheral vascular disease, unspecified: Secondary | ICD-10-CM | POA: Diagnosis not present

## 2021-06-04 DIAGNOSIS — E782 Mixed hyperlipidemia: Secondary | ICD-10-CM | POA: Diagnosis not present

## 2021-06-04 DIAGNOSIS — I10 Essential (primary) hypertension: Secondary | ICD-10-CM | POA: Diagnosis not present

## 2021-06-04 DIAGNOSIS — I255 Ischemic cardiomyopathy: Secondary | ICD-10-CM | POA: Diagnosis not present

## 2021-06-04 DIAGNOSIS — H5212 Myopia, left eye: Secondary | ICD-10-CM | POA: Diagnosis not present

## 2021-06-04 DIAGNOSIS — E1151 Type 2 diabetes mellitus with diabetic peripheral angiopathy without gangrene: Secondary | ICD-10-CM | POA: Diagnosis not present

## 2021-06-04 DIAGNOSIS — I2510 Atherosclerotic heart disease of native coronary artery without angina pectoris: Secondary | ICD-10-CM | POA: Diagnosis not present

## 2021-06-22 ENCOUNTER — Other Ambulatory Visit: Payer: Self-pay | Admitting: *Deleted

## 2021-06-22 NOTE — Patient Outreach (Signed)
[facility]) Care Management  06/22/2021  Christine Levy 03-Dec-1946 [id number]  Telephone outreach for [employer] referral for care management. Unanswered phone, however, was able to leave a message, requested a return call. If NP does not receive a return call today, will try again tomorrow.  [name]. [name], MSN, [facility] Gerontological Nurse Practitioner [facility] Care Management [phone]

## 2021-06-23 ENCOUNTER — Other Ambulatory Visit: Payer: Self-pay | Admitting: *Deleted

## 2021-06-23 NOTE — Patient Outreach (Addendum)
[facility]) Care Management  06/23/2021  Avery Eustice 1946-02-22 [id number]   Second telephone outreach for [employer] referral. No answer, left a second message and will send unsuccessful letter.  [name]. [name], MSN, GNP-BC Gerontological Nurse Practitioner [facility] Care Management [phone]  Ms. Hale Bogus returned NP call. Gave information regarding services and she immediately told NP she did not need care management services. NP advised would send our information for future reference.  [name]. [name], MSN, [facility] Gerontological Nurse Practitioner [facility] Care Management [phone]

## 2021-07-09 ENCOUNTER — Other Ambulatory Visit: Payer: Self-pay | Admitting: *Deleted

## 2021-07-09 NOTE — Patient Outreach (Signed)
[facility]) Care Management  07/09/2021  Christine Levy 1946/10/26 [id number]  Final telephone outreach for [facility] Care Management services, no answer, left another message, referenced previously sent letter. Advised she can call me in the future should she decide she would like to participate in our program.  Noralyn Pick C. [name], MSN, [facility] Gerontological Nurse Practitioner [facility] Care Management [phone]

## 2021-08-26 DIAGNOSIS — R790 Abnormal level of blood mineral: Secondary | ICD-10-CM | POA: Diagnosis not present

## 2021-09-29 DIAGNOSIS — Z1231 Encounter for screening mammogram for malignant neoplasm of breast: Secondary | ICD-10-CM | POA: Diagnosis not present

## 2022-02-10 ENCOUNTER — Other Ambulatory Visit: Payer: Self-pay

## 2022-02-10 ENCOUNTER — Ambulatory Visit: Admitting: Cardiology

## 2022-02-10 VITALS — BP 123/68 | HR 81 | Temp 98.0°F | Ht 61.0 in | Wt 229.0 lb

## 2022-02-10 DIAGNOSIS — E875 Hyperkalemia: Secondary | ICD-10-CM

## 2022-02-10 DIAGNOSIS — I255 Ischemic cardiomyopathy: Secondary | ICD-10-CM

## 2022-02-10 DIAGNOSIS — I25118 Atherosclerotic heart disease of native coronary artery with other forms of angina pectoris: Secondary | ICD-10-CM

## 2022-02-10 NOTE — Progress Notes (Signed)
Follow up visit  Subjective:   Christine Levy, female    DOB: 1946-12-03, 76 y.o.   MRN: [id number]   HPI   Chief Complaint  Patient presents with   Coronary Artery Disease   Follow-up   Shortness of Breath    76-y/o Caucasian female with hypertension, type 2 diabetes mellitus, coronary artery disease status post overlapping stents in LAD 2019, ischemic cardiomyopathy with recovered EF 55 to 60% in 2019, PAD status post bilateral iliac stenting 2018.  Patient denies chest pain, but has had exertional dyspnea with minimal activity.  She feels her symptoms are similar to prior to her PCI in 2019.  She has previously had an abnormal stress test with mild peri-infarct ischemia.  We have chosen to pursue medical therapy given her renal dysfunction at that time.  OV 02/2020: Patient spends 6 months in [place], 6 months in [place].  She was last seen by Dr. Einar Gip in 2019.  Recently, patient has experienced complains of lightheadedness.  She has also experienced exertional dyspnea with minimal activity.  She denies any chest pain.  That said, patient did not have any chest pain when she had her MI.  She describes her shortness of breath is not as worse as it was during her MI in 2017.  Recently, she stopped her diuretic chlorthalidone due to symptoms of lightheadedness.  However, she gained 5 pounds weight in 2 days.  Patient was seen in [facility] long hospital emergency department on 03/24/2020.  Her work-up showed mildly elevated potassium at 5.2.    Current Outpatient Medications on File Prior to Visit  Medication Sig Dispense Refill   aspirin EC 81 MG tablet Take 81 mg by mouth daily.     atorvastatin (LIPITOR) 40 MG tablet Take 80 mg by mouth every evening.      chlorthalidone (HYGROTON) 25 MG tablet Take 1 tablet (25 mg total) by mouth every morning. 30 tablet 0   Dulaglutide 1.5 MG/0.5ML SOPN Inject 1.5 mg into the skin every 7 (seven) days.     insulin glargine (LANTUS) 100 UNIT/ML  injection Inject 20 Units into the skin 2 (two) times daily.      nitroGLYCERIN (NITROSTAT) 0.4 MG SL tablet Place 0.4 mg under the tongue every 5 (five) minutes as needed for chest pain.     PARoxetine (PAXIL) 10 MG tablet Take 10 mg by mouth daily.     Probiotic Product (PROBIOTIC PO) Take 1 tablet by mouth daily.     rOPINIRole (REQUIP) 2 MG tablet Take 3 mg by mouth at bedtime.      sacubitril-valsartan (ENTRESTO) 49-51 MG Take 1 tablet by mouth 2 (two) times daily.     No current facility-administered medications on file prior to visit.    Cardiovascular & other pertient studies:  EKG 02/10/2022: Sinus rhythm 83 bpm Left bundle branch block  Echocardiogram 10/19/2020:  Normal LV systolic function with visual EF 55-60%. Left ventricle cavity  is normal in size. Mild concentric hypertrophy of the left ventricle.  Normal global wall motion. Normal diastolic filling pattern.  Mild (Grade I) mitral regurgitation.  Mild pulmonary hypertension. RVSP measures 42 mmHg.  IVC is normal with a respiratory response of <50%.  Compared to prior study dated 04/06/2020: TR and MR was moderate and now  MR is mild and TR is trace. LVEF remains stable.  Lexiscan Tetrofosmin stress test 04/06/2020: No previous exam available for comparison. Lexiscan nuclear stress test performed using 1-day protocol. Stress EKG is non-diagnostic,  as this is pharmacological stress test. In addition rest and stress EKG showed sinus rhythm, LBBB. Stress LVEF 53%. SPECT images showed medium sized, medium intensity, partially reversible perfusion defect in apical anteroseptal myocardium.  Intermediate risk study.    Lower extremity angiography/intervention 01/2017: Angiographic data: Abdominal aorta shows mild atherosclerotic changes. Only the distal abdominal aorta visualized. The right common iliac artery is occluded and fills by collaterals. Right common femoral artery is widely patent. Left iliac artery shows a 40-50%  stenosis at ostium, mild disease in the left external iliac artery. Left femoral artery is widely patent.   Successful PTA and stenting of the bilateral common iliac artery with implantation of 9.0 x 37 mm express LD on the right common iliac artery and 9.0 x 25 mm express LD on the left common iliac artery, stenosis reduced from 100% to 0% on the right and 50% to 0% on the left.  Coronary angiogram 2017 [facility]): Severe diffuse triple-vessel coronary disease, small calibered vessels.   Distal circumflex 80%, distal PL branch 80%, LAD 90% stenosis, successful angioplasty with placement of 2.25 x 12, 2.25 x 23, 2.5 x 33 and 2.5 x 8 overlapping  Xience DES.  Mid to distal anteroapical and anterior and inferoapical dyskinesis, LVEF 25%.  Recent labs: 06/04/2021: Glucose 100, BUN/Cr 25/1.32. EGFR 39. Na/K 143/4.8. Rest of the CMP normal H/H 10.5/29.9. MCV 89. Platelets 246 HbA1C 6.7%  04/01/2020: Chol 129, TG 130, HDL 35, LDL 71 BNP 172  03/24/2020: Glucose 127, BUN/Cr 21/1.41. EGFR 37. Na/K 142/5.2.  H/H 10.7/33.3. MCV 94. Platelets 266   03/24/2020: Glucose 127, BUN/Cr 21/1.41. EGFR 37. Na/K 142/5.2.  H/H 10.7/33.3. MCV 94. Platelets 266 BNP 100    Review of Systems  Cardiovascular:  Positive for dyspnea on exertion. Negative for chest pain, leg swelling, palpitations and syncope.  Neurological:  Positive for light-headedness.        Vitals:   02/10/22 1123  BP: 123/68  Pulse: 81  Temp: 98 F (36.7 C)  SpO2: 97%     Body mass index is 43.27 kg/m.   Objective:   Physical Exam Vitals and nursing note reviewed.  Constitutional:      Appearance: She is well-developed.  Neck:     Vascular: No JVD.  Cardiovascular:     Rate and Rhythm: Normal rate and regular rhythm.     Pulses: Intact distal pulses.     Heart sounds: Normal heart sounds. No murmur heard. Pulmonary:     Effort: Pulmonary effort is normal.     Breath sounds: Normal breath sounds. No wheezing or rales.   Musculoskeletal:     Right lower leg: No edema.     Left lower leg: No edema.          Assessment & Recommendations:   76-y/o Caucasian female with hypertension, type 2 diabetes mellitus, coronary artery disease status post overlapping stents in LAD 2019, ischemic cardiomyopathy with recovered EF 55 to 60% in 2019, PAD status post bilateral iliac stenting 2018,   Ischemic cardiomyopathy: Exertional dyspnea. Check echocardiogram, proBNP.   Continue Entresto.  Added metoprolol succinate 25 mg daily.  CAD: Symptoms of exertional dyspnea similar to prior symptoms, concerning for angina equivalent. Minimal reversibility on stress suggesting predominantly old LAD infarct (stress testing 03/2020) Primary PCI to LAD in [place] in 2017. Continue Aspirin, statin.  Add metoprolol succinate 25 mg daily. If symptoms no better, will discuss invasive ischemia evaluation given prior abnormal stress test with peri-infarct ischemia.  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 Pager: [phone] Office: [phone]

## 2022-02-12 ENCOUNTER — Encounter: Payer: Self-pay | Admitting: Cardiology

## 2022-02-12 MED ORDER — METOPROLOL SUCCINATE ER 25 MG PO TB24: 25.0000 mg | ORAL_TABLET | Freq: Every day | ORAL | 3 refills | Status: DC

## 2022-02-15 ENCOUNTER — Ambulatory Visit: Payer: Medicare Other

## 2022-02-15 ENCOUNTER — Other Ambulatory Visit: Payer: Self-pay

## 2022-02-15 DIAGNOSIS — I25118 Atherosclerotic heart disease of native coronary artery with other forms of angina pectoris: Secondary | ICD-10-CM

## 2022-02-15 LAB — LIPID PANEL
Chol/HDL Ratio: 4.1 ratio (ref 0.0–4.4)
Cholesterol, Total: 147 mg/dL (ref 100–199)
HDL: 36 mg/dL — ABNORMAL LOW (ref >39)
LDL Chol Calc (NIH): 79 mg/dL (ref 0–99)
Triglycerides: 189 mg/dL — ABNORMAL HIGH (ref 0–149)
VLDL Cholesterol Cal: 32 mg/dL (ref 5–40)

## 2022-02-15 LAB — CBC
Hematocrit: 38.1 % (ref 34.0–46.6)
Hemoglobin: 12.6 g/dL (ref 11.1–15.9)
MCH: 30.4 pg (ref 26.6–33.0)
MCHC: 33.1 g/dL (ref 31.5–35.7)
MCV: 92 fL (ref 79–97)
Platelets: 305 10*3/uL (ref 150–450)
RBC: 4.14 x10E6/uL (ref 3.77–5.28)
RDW: 11.8 % (ref 11.7–15.4)
WBC: 9.5 10*3/uL (ref 3.4–10.8)

## 2022-02-15 LAB — BASIC METABOLIC PANEL
BUN/Creatinine Ratio: 18 (ref 12–28)
BUN: 26 mg/dL (ref 8–27)
CO2: 23 mmol/L (ref 20–29)
Calcium: 9.7 mg/dL (ref 8.7–10.3)
Chloride: 102 mmol/L (ref 96–106)
Creatinine, Ser: 1.47 mg/dL — ABNORMAL HIGH (ref 0.57–1.00)
Glucose: 324 mg/dL — ABNORMAL HIGH (ref 70–99)
Potassium: 5.6 mmol/L — ABNORMAL HIGH (ref 3.5–5.2)
Sodium: 136 mmol/L (ref 134–144)
eGFR: 37 mL/min/{1.73_m2} — ABNORMAL LOW (ref >59)

## 2022-02-15 LAB — HEMOGLOBIN A1C
Est. average glucose Bld gHb Est-mCnc: 272 mg/dL
Hgb A1c MFr Bld: 11.1 % — ABNORMAL HIGH (ref 4.8–5.6)

## 2022-02-15 LAB — PRO B NATRIURETIC PEPTIDE: NT-Pro BNP: 298 pg/mL (ref 0–738)

## 2022-02-17 NOTE — Addendum Note (Signed)
Addended by: Elder Negus on: 02/17/2022 10:43 AM   Modules accepted: Orders

## 2022-02-17 NOTE — Progress Notes (Signed)
Called patient, NA, no VMbox.

## 2022-02-18 NOTE — Progress Notes (Signed)
Tried calling patient --no answer

## 2022-02-18 NOTE — Progress Notes (Signed)
Pt called back, spoke to her regarding her lab results.

## 2022-02-21 NOTE — Progress Notes (Signed)
Follow up visit  Subjective:   Christine Levy, female    DOB: Dec 12, 1946, 76 y.o.   MRN: [id number]   HPI   No chief complaint on file.  75-y/o Caucasian female with hypertension, uncontrolled type 2 diabetes mellitus, coronary artery disease status post overlapping stents in LAD 2019, ischemic cardiomyopathy with recovered EF 55 to 60% in 2019, PAD status post bilateral iliac stenting 2018, now with AKI/CKD  At last visit in 01/2022, I recommended echocardiogram to evaluate her exertional dyspnea.  Echocardiogram showed moderate LVH, otherwise unremarkable, details below.  In the meantime, she has lost weight with changing her diet to keto diet, and her dyspnea symptoms have improved.  However, her labs were concerning for AKI, and mild hyperkalemia.also, her A1c was remarkably elevated at 11%, up from 6% a year ago.  I recommended increase fluid intake, stopped Entresto and chlorthalidone, and made a referral to nephrology.    Patient is here for follow-up today.  As discussed above, her dyspnea symptoms have improved.  Patient does not have a PCP follow-up until late April 2023.  She is reluctant to see nephrology in endocrinology at this time.  On a separate note, she tells me that she needs follow-up for her thyroid, given that she had some surgery as a child, unsure if it was diabetes or thyroid removal.  Blood pressure is elevated today, since she was asked to hold Entresto and chlorthalidone due to AKI.   Tel encounter 02/25/2022: Reviewed results. AKI, A1C 11%. Patient recently started keto diet and has felt dehydrated. Recommend liberal hydration, stopping keto diet. Also hold Entresto and chlorthalidone. A1C 11%! Glucose has been very uncontrolled. Strongly encourage re-establishing care with PCP. Referred to nephrology for AKI.  OV 02/2020: Patient spends 6 months in [place], 6 months in [place].  She was last seen by Dr. Einar Gip in 2019.  Recently, patient has experienced  complains of lightheadedness.  She has also experienced exertional dyspnea with minimal activity.  She denies any chest pain.  That said, patient did not have any chest pain when she had her MI.  She describes her shortness of breath is not as worse as it was during her MI in 2017.  Recently, she stopped her diuretic chlorthalidone due to symptoms of lightheadedness.  However, she gained 5 pounds weight in 2 days.  Patient was seen in [facility] long [facility] emergency department on 03/24/2020.  Her work-up showed mildly elevated potassium at 5.2.    Current Outpatient Medications on File Prior to Visit  Medication Sig Dispense Refill   aspirin EC 81 MG tablet Take 81 mg by mouth daily.     atorvastatin (LIPITOR) 40 MG tablet Take 80 mg by mouth every evening.      chlorthalidone (HYGROTON) 25 MG tablet Take 1 tablet (25 mg total) by mouth every morning. 30 tablet 0   Dulaglutide 1.5 MG/0.5ML SOPN Inject 1.5 mg into the skin every 7 (seven) days.     insulin glargine (LANTUS) 100 UNIT/ML injection Inject 20 Units into the skin 2 (two) times daily.      metoprolol succinate (TOPROL-XL) 25 MG 24 hr tablet Take 1 tablet (25 mg total) by mouth daily. Take with or immediately following a meal. 30 tablet 3   nitroGLYCERIN (NITROSTAT) 0.4 MG SL tablet Place 0.4 mg under the tongue every 5 (five) minutes as needed for chest pain.     PARoxetine (PAXIL) 10 MG tablet Take 10 mg by mouth daily.  Probiotic Product (PROBIOTIC PO) Take 1 tablet by mouth daily.     rOPINIRole (REQUIP) 2 MG tablet Take 3 mg by mouth at bedtime.      sacubitril-valsartan (ENTRESTO) 49-51 MG Take 1 tablet by mouth 2 (two) times daily.     No current facility-administered medications on file prior to visit.    Cardiovascular & other pertient studies:  Echocardiogram 02/15/2022: Normal LV systolic function with EF 60%. Left ventricle cavity is normal in size. Moderate concentric hypertrophy of the left ventricle. Normal global wall  motion. Normal diastolic filling pattern, normal LAP.  Mild tricuspid regurgitation. No evidence of pulmonary hypertension. Compared to study 10/19/2020 MR and PR have improved and mild PTHN is not noted on current study.   EKG 02/10/2022: Sinus rhythm 83 bpm Left bundle branch block  Lexiscan Tetrofosmin stress test 04/06/2020: No previous exam available for comparison. Lexiscan nuclear stress test performed using 1-day protocol. Stress EKG is non-diagnostic, as this is pharmacological stress test. In addition rest and stress EKG showed sinus rhythm, LBBB. Stress LVEF 53%. SPECT images showed medium sized, medium intensity, partially reversible perfusion defect in apical anteroseptal myocardium.  Intermediate risk study.    Lower extremity angiography/intervention 01/2017: Angiographic data: Abdominal aorta shows mild atherosclerotic changes. Only the distal abdominal aorta visualized. The right common iliac artery is occluded and fills by collaterals. Right common femoral artery is widely patent. Left iliac artery shows a 40-50% stenosis at ostium, mild disease in the left external iliac artery. Left femoral artery is widely patent.   Successful PTA and stenting of the bilateral common iliac artery with implantation of 9.0 x 37 mm express LD on the right common iliac artery and 9.0 x 25 mm express LD on the left common iliac artery, stenosis reduced from 100% to 0% on the right and 50% to 0% on the left.  Coronary angiogram 2017 [facility]): Severe diffuse triple-vessel coronary disease, small calibered vessels.   Distal circumflex 80%, distal PL branch 80%, LAD 90% stenosis, successful angioplasty with placement of 2.25 x 12, 2.25 x 23, 2.5 x 33 and 2.5 x 8 overlapping  Xience DES.  Mid to distal anteroapical and anterior and inferoapical dyskinesis, LVEF 25%.  Recent labs: 02/24/2022: Glucose 134, BUN/Cr 44/2.03. EGFR 25. Na/K 137/5.2.   02/14/2022: Glucose 324, BUN/Cr 26/1.47. EGFR 37. Na/K  137/5.6.  Chol 147, TG 189, HDL 36, LDL 79  06/04/2021: Glucose 100, BUN/Cr 25/1.32. EGFR 39. Na/K 143/4.8. Rest of the CMP normal H/H 10.5/29.9. MCV 89. Platelets 246 HbA1C 6.7%  04/01/2020: Chol 129, TG 130, HDL 35, LDL 71 BNP 172  03/24/2020: Glucose 127, BUN/Cr 21/1.41. EGFR 37. Na/K 142/5.2.  H/H 10.7/33.3. MCV 94. Platelets 266   03/24/2020: Glucose 127, BUN/Cr 21/1.41. EGFR 37. Na/K 142/5.2.  H/H 10.7/33.3. MCV 94. Platelets 266 BNP 100    Review of Systems  Cardiovascular:  Positive for dyspnea on exertion. Negative for chest pain, leg swelling, palpitations and syncope.  Neurological:  Positive for light-headedness.        Vitals:   03/03/22 1359  BP: 136/83  Pulse: 68  Resp: 12  Temp: (!) 97.1 F (36.2 C)  SpO2: 96%     Body mass index is 41.95 kg/m.   Objective:   Physical Exam Vitals and nursing note reviewed.  Constitutional:      Appearance: She is well-developed.  Neck:     Vascular: No JVD.  Cardiovascular:     Rate and Rhythm: Normal rate and regular rhythm.  Pulses: Intact distal pulses.     Heart sounds: Normal heart sounds. No murmur heard. Pulmonary:     Effort: Pulmonary effort is normal.     Breath sounds: Normal breath sounds. No wheezing or rales.  Musculoskeletal:     Right lower leg: No edema.     Left lower leg: No edema.       ICD-10-CM   1. AKI (acute kidney injury) ([place])  Z48.2 Basic metabolic panel    2. Coronary artery disease involving native coronary artery of native heart with other form of angina pectoris ([place])  I25.118     3. Ischemic cardiomyopathy  I25.5     4. Essential hypertension  I10 amLODipine (NORVASC) 5 MG tablet    5. Chronic fatigue  R53.82 TSH     Orders Placed This Encounter  Procedures   Basic metabolic panel   TSH   Meds ordered this encounter  Medications   amLODipine (NORVASC) 5 MG tablet    Sig: Take 1 tablet (5 mg total) by mouth daily.    Dispense:  30 tablet    Refill:  3         Assessment & Recommendations:   75-y/o Caucasian female with hypertension, uncontrolled type 2 diabetes mellitus, coronary artery disease status post overlapping stents in LAD 2019, ischemic cardiomyopathy with recovered EF 55 to 60% in 2019, PAD status post bilateral iliac stenting 2018, now with AKI/CKD  AKI/CKD: Creatinine 1.06 (10/2020), 1.47 (02/14/2022), 2.03 (02/24/2022). Continue to hold Entresto and chlorthalidone indefinitely. Repeat BMP.  At patient's request, will also check TSH.  Ischemic cardiomyopathy: Recovered EF.   Exertional dyspnea improved since weight loss. Holding Entresto and chlorthalidone due to AKI.   Outpatient to start taking metoprolol succinate 25 mg daily, prescribed on previous visit.  Hypertension: Uncontrolled since stopping Entresto and chlorthalidone due to AKI. Start taking metoprolol succinate 25 mg daily, also added amlodipine 5 mg daily.  CAD: Primary PCI to LAD in [place] in 2017. Continue Aspirin, statin, take metoprolol succinate 25 mg daily. No anginal symptoms at this time.  Type II DM: Uncontrolled, A1c 11%.  Patient feels that keto diet is helping her blood sugar come down. I cautioned the patient given her recent AKI that temporally correlated with keto diet, as well as high saturated fat content of keto diet.  She remains reluctant to see nephrology and endocrinology at this time.  We mutually decided to hold off nephrology referral unless creatinine remains elevated on repeat check as of today.  F/u in 4 weeks  Time spent: 45 min  [place], MD [facility] Pager: [phone] Office: [phone]

## 2022-02-25 ENCOUNTER — Other Ambulatory Visit: Payer: Self-pay | Admitting: Cardiology

## 2022-02-25 DIAGNOSIS — N179 Acute kidney failure, unspecified: Secondary | ICD-10-CM

## 2022-02-25 LAB — BASIC METABOLIC PANEL
BUN/Creatinine Ratio: 22 (ref 12–28)
BUN: 44 mg/dL — ABNORMAL HIGH (ref 8–27)
CO2: 20 mmol/L (ref 20–29)
Calcium: 9.9 mg/dL (ref 8.7–10.3)
Chloride: 102 mmol/L (ref 96–106)
Creatinine, Ser: 2.03 mg/dL — ABNORMAL HIGH (ref 0.57–1.00)
Glucose: 134 mg/dL — ABNORMAL HIGH (ref 70–99)
Potassium: 5.2 mmol/L (ref 3.5–5.2)
Sodium: 137 mmol/L (ref 134–144)
eGFR: 25 mL/min/{1.73_m2} — ABNORMAL LOW (ref >59)

## 2022-02-25 NOTE — Progress Notes (Signed)
Reviewed results. AKI, A1C 11%. Patient recently started keto diet and has felt dehydrated. Recommend liberal hydration, stopping keto diet. Also hold Entresto and chlorthalidone.  A1C 11%! Glucose has been very uncontrolled. Strongly encourage re-establishing care with PCP.  Referred to nephrology for AKI.  Elder Negus, MD

## 2022-03-03 ENCOUNTER — Ambulatory Visit: Payer: Medicare Other | Admitting: Cardiology

## 2022-03-03 ENCOUNTER — Other Ambulatory Visit: Payer: Self-pay

## 2022-03-03 ENCOUNTER — Encounter: Payer: Self-pay | Admitting: Cardiology

## 2022-03-03 VITALS — BP 168/82 | HR 92 | Temp 97.1°F | Resp 12 | Ht 61.0 in | Wt 222.0 lb

## 2022-03-03 DIAGNOSIS — I255 Ischemic cardiomyopathy: Secondary | ICD-10-CM

## 2022-03-03 DIAGNOSIS — N179 Acute kidney failure, unspecified: Secondary | ICD-10-CM

## 2022-03-03 DIAGNOSIS — R5382 Chronic fatigue, unspecified: Secondary | ICD-10-CM

## 2022-03-03 DIAGNOSIS — I10 Essential (primary) hypertension: Secondary | ICD-10-CM

## 2022-03-03 DIAGNOSIS — I25118 Atherosclerotic heart disease of native coronary artery with other forms of angina pectoris: Secondary | ICD-10-CM

## 2022-03-03 MED ORDER — AMLODIPINE BESYLATE 5 MG PO TABS: 5.0000 mg | ORAL_TABLET | Freq: Every day | ORAL | 3 refills | Status: DC

## 2022-03-04 LAB — TSH: TSH: 1.91 u[IU]/mL (ref 0.450–4.500)

## 2022-03-10 ENCOUNTER — Telehealth: Payer: Self-pay

## 2022-03-10 NOTE — Telephone Encounter (Signed)
BMP order released. Spoke with the patient.  ? ?Thanks ?MJP ? ?

## 2022-03-10 NOTE — Telephone Encounter (Signed)
Pt called requesting lab results. Please advise.  

## 2022-03-12 LAB — BASIC METABOLIC PANEL
BUN/Creatinine Ratio: 26 (ref 12–28)
BUN: 41 mg/dL — ABNORMAL HIGH (ref 8–27)
CO2: 17 mmol/L — ABNORMAL LOW (ref 20–29)
Calcium: 9.7 mg/dL (ref 8.7–10.3)
Chloride: 105 mmol/L (ref 96–106)
Creatinine, Ser: 1.58 mg/dL — ABNORMAL HIGH (ref 0.57–1.00)
Glucose: 161 mg/dL — ABNORMAL HIGH (ref 70–99)
Potassium: 5.3 mmol/L — ABNORMAL HIGH (ref 3.5–5.2)
Sodium: 139 mmol/L (ref 134–144)
eGFR: 34 mL/min/{1.73_m2} — ABNORMAL LOW (ref >59)

## 2022-03-17 IMAGING — CR DG CHEST 2V
2 series · 2 of 2 positions shown · non-contrast
Comparison: Chest radiograph 01/03/2018

CLINICAL DATA: Shortness of breath.

EXAM:
CHEST - 2 VIEW

[w chest pa]
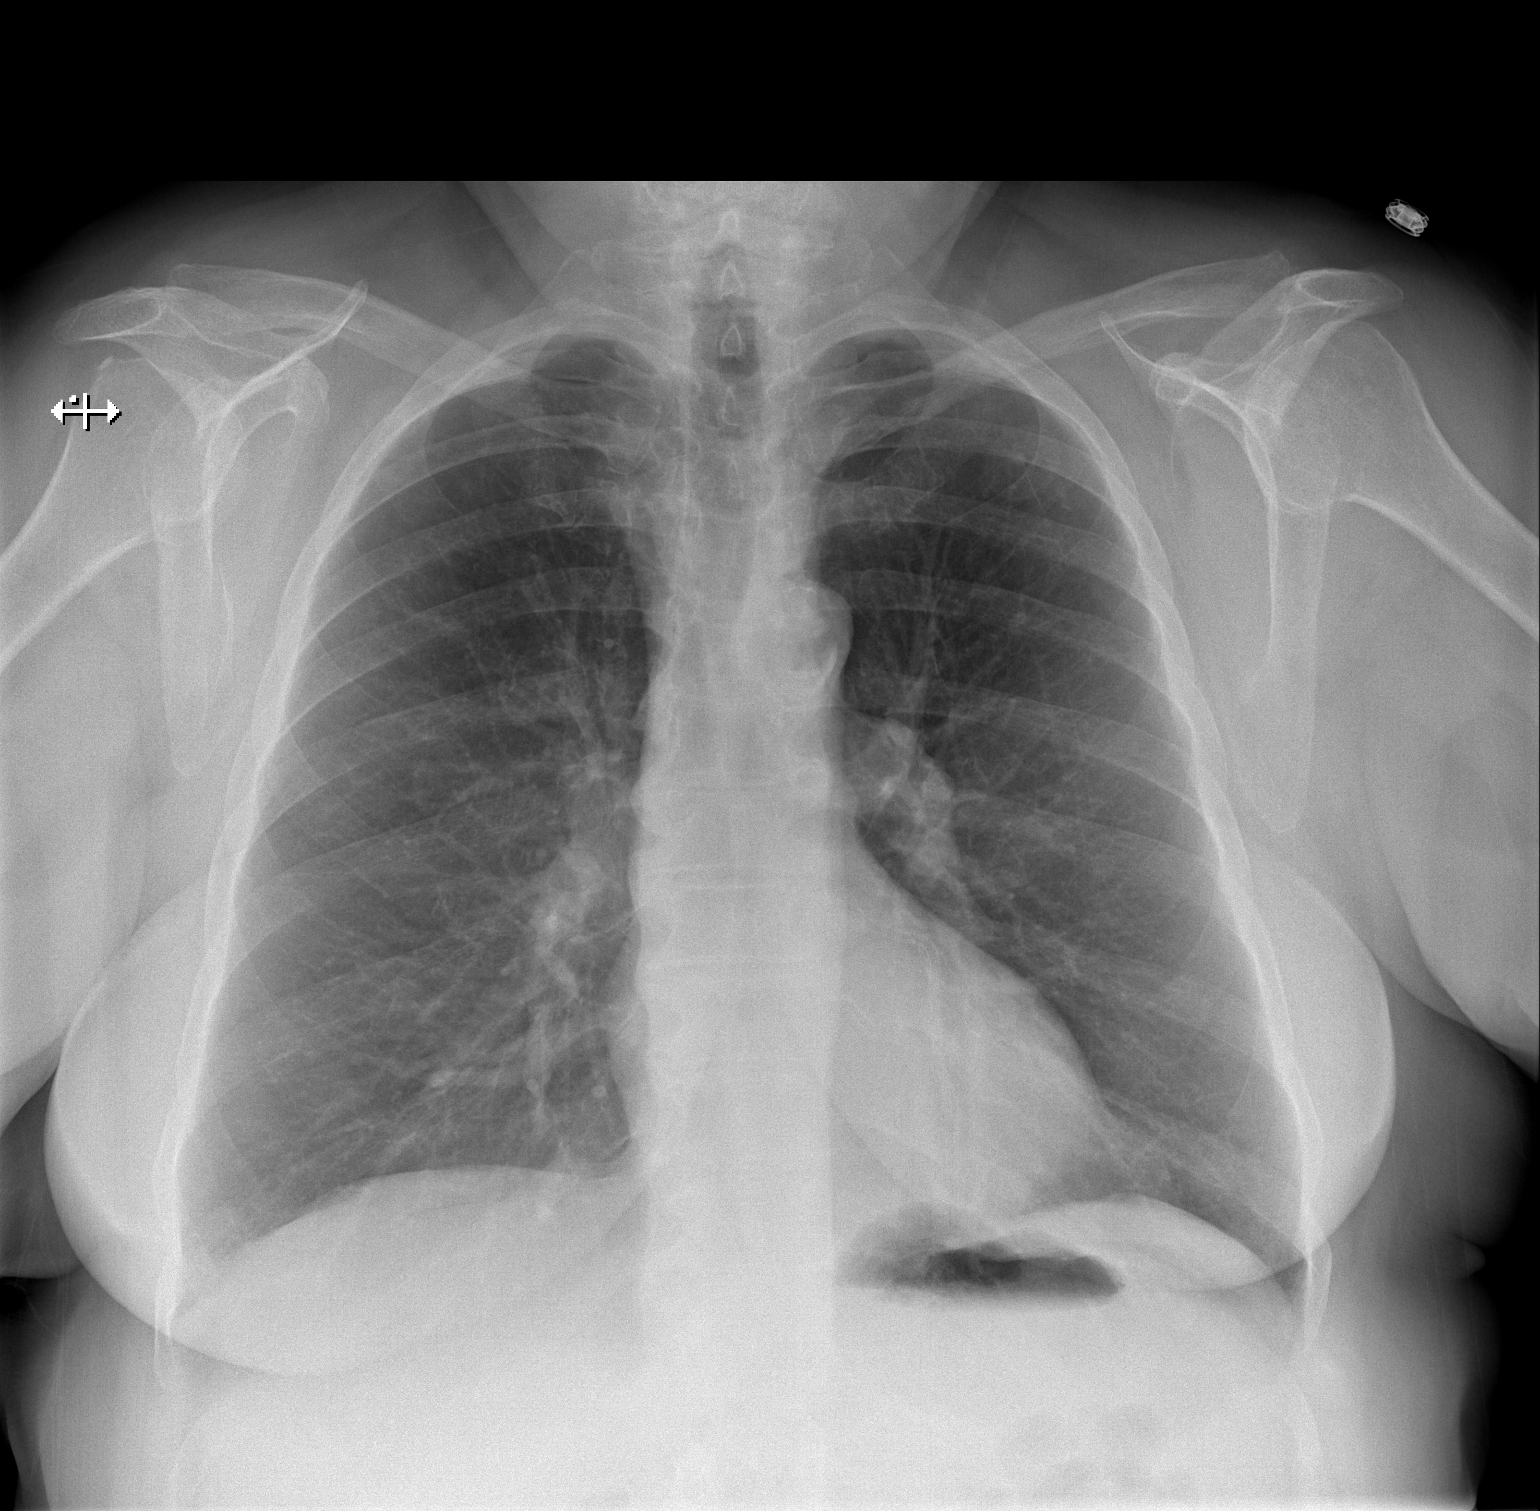

[w chest lat]
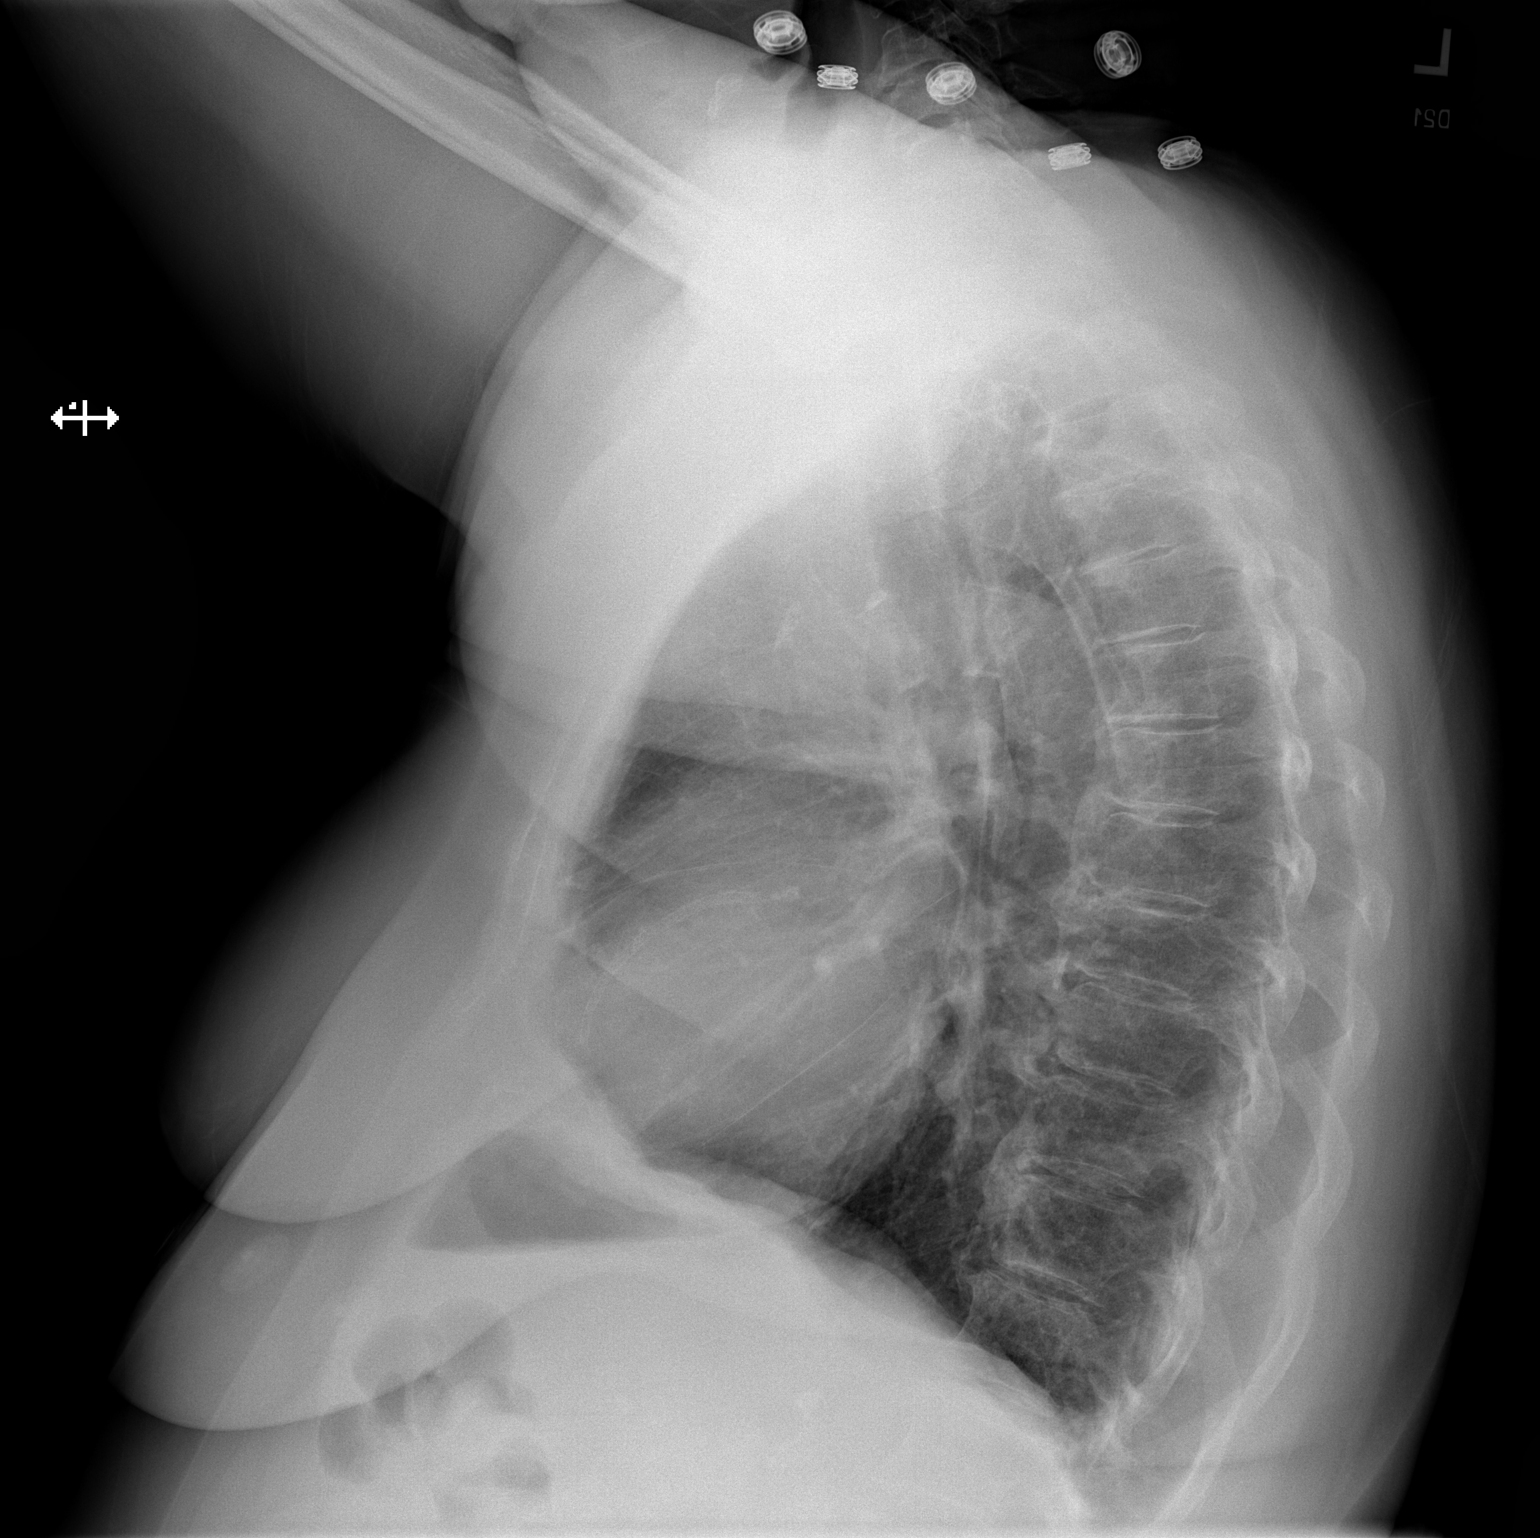

[2 of 2 positions shown; findings below may reference images not displayed]

FINDINGS: Heart size within normal limits. Aortic atherosclerosis. No evidence
of airspace consolidation within the lungs. No evidence of pleural
effusion or pneumothorax. No acute bony abnormality. Thoracic
spondylosis.
IMPRESSION: No evidence of acute cardiopulmonary abnormality.

Aortic atherosclerosis.

## 2022-03-18 ENCOUNTER — Other Ambulatory Visit: Payer: Self-pay

## 2022-03-18 DIAGNOSIS — I10 Essential (primary) hypertension: Secondary | ICD-10-CM

## 2022-03-18 MED ORDER — AMLODIPINE BESYLATE 5 MG PO TABS: 5.0000 mg | ORAL_TABLET | Freq: Every day | ORAL | 3 refills | Status: DC

## 2022-03-18 NOTE — Progress Notes (Signed)
Called pt to inform her about her lab results. Pt understood

## 2022-04-05 ENCOUNTER — Encounter: Payer: Self-pay | Admitting: Cardiology

## 2022-04-05 ENCOUNTER — Ambulatory Visit: Payer: Medicare Other | Admitting: Cardiology

## 2022-04-05 ENCOUNTER — Other Ambulatory Visit: Payer: Self-pay

## 2022-04-05 VITALS — BP 124/54 | HR 85 | Resp 16 | Ht 61.0 in | Wt 220.3 lb

## 2022-04-05 DIAGNOSIS — I255 Ischemic cardiomyopathy: Secondary | ICD-10-CM

## 2022-04-05 DIAGNOSIS — I10 Essential (primary) hypertension: Secondary | ICD-10-CM

## 2022-04-05 DIAGNOSIS — I25118 Atherosclerotic heart disease of native coronary artery with other forms of angina pectoris: Secondary | ICD-10-CM

## 2022-04-05 DIAGNOSIS — N1832 Chronic kidney disease, stage 3b: Secondary | ICD-10-CM

## 2022-04-05 MED ORDER — AMLODIPINE BESYLATE 5 MG PO TABS: 5.0000 mg | ORAL_TABLET | Freq: Every day | ORAL | 3 refills | Status: DC

## 2022-04-05 MED ORDER — METOPROLOL SUCCINATE ER 25 MG PO TB24: 25.0000 mg | ORAL_TABLET | Freq: Every day | ORAL | 3 refills | Status: DC

## 2022-04-05 MED ORDER — ASPIRIN EC 81 MG PO TBEC: 81.0000 mg | DELAYED_RELEASE_TABLET | Freq: Every day | ORAL | 3 refills | Status: DC

## 2022-04-05 MED ORDER — ATORVASTATIN CALCIUM 40 MG PO TABS: 80.0000 mg | ORAL_TABLET | Freq: Every evening | ORAL | 3 refills | Status: DC

## 2022-04-05 MED ORDER — NITROGLYCERIN 0.4 MG SL SUBL: 0.4000 mg | SUBLINGUAL_TABLET | SUBLINGUAL | 2 refills | Status: AC | PRN

## 2022-04-05 MED ORDER — ASPIRIN EC 81 MG PO TBEC: 81.0000 mg | DELAYED_RELEASE_TABLET | Freq: Every day | ORAL | 3 refills | Status: AC

## 2022-04-05 MED ORDER — NITROGLYCERIN 0.4 MG SL SUBL: 0.4000 mg | SUBLINGUAL_TABLET | SUBLINGUAL | 2 refills | Status: DC | PRN

## 2022-04-05 NOTE — Progress Notes (Signed)
? ?Follow up visit ? ?Subjective:  ? ?Christine Levy, female    DOB: 04/26/1946, 76 y.o.   MRN: [id number] ? ? ?HPI ? ? ?Chief Complaint  ?Patient presents with  ? Coronary Artery Disease  ? Follow-up  ? ?76-y/o Caucasian female with hypertension, uncontrolled type 2 diabetes mellitus, coronary artery disease status post overlapping stents in LAD 2019, ischemic cardiomyopathy with recovered EF 55 to 60% in 2019, PAD status post bilateral iliac stenting 2018, CKD 3b ? ?Patient is doing well. Blood pressure is now very well controlled. She is staying off Entresto and chlorthalidone. Cr has improved.  ? ? ? ?Current Outpatient Medications:  ?  amLODipine (NORVASC) 5 MG tablet, Take 1 tablet (5 mg total) by mouth daily., Disp: 90 tablet, Rfl: 3 ?  aspirin EC 81 MG tablet, Take 81 mg by mouth daily., Disp: , Rfl:  ?  atorvastatin (LIPITOR) 40 MG tablet, Take 80 mg by mouth every evening. , Disp: , Rfl:  ?  Dulaglutide 1.5 MG/0.5ML SOPN, Inject 1.5 mg into the skin every 7 (seven) days., Disp: , Rfl:  ?  insulin glargine (LANTUS) 100 UNIT/ML injection, Inject 20 Units into the skin 2 (two) times daily. , Disp: , Rfl:  ?  Magnesium Citrate 100 MG TABS, Take 1 tablet by mouth in the morning and at bedtime., Disp: , Rfl:  ?  metoprolol succinate (TOPROL-XL) 25 MG 24 hr tablet, Take 25 mg by mouth daily., Disp: , Rfl:  ?  nitroGLYCERIN (NITROSTAT) 0.4 MG SL tablet, Place 0.4 mg under the tongue every 5 (five) minutes as needed for chest pain., Disp: , Rfl:  ?  PARoxetine (PAXIL) 10 MG tablet, Take 10 mg by mouth daily., Disp: , Rfl:  ?  Probiotic Product (PROBIOTIC PO), Take 1 tablet by mouth daily., Disp: , Rfl:  ?  rOPINIRole (REQUIP) 2 MG tablet, Take 3 mg by mouth at bedtime. , Disp: , Rfl:  ? ?Cardiovascular & other pertient studies: ? ?Echocardiogram 02/15/2022: ?Normal LV systolic function with EF 60%. Left ventricle cavity is normal in size. Moderate concentric hypertrophy of the left ventricle. Normal global wall motion.  Normal diastolic filling pattern, normal LAP.  ?Mild tricuspid regurgitation. No evidence of pulmonary hypertension. ?Compared to study 10/19/2020 MR and PR have improved and mild PTHN is not noted on current study.  ? ?EKG 02/10/2022: ?Sinus rhythm 83 bpm ?Left bundle branch block ? ?Lexiscan Tetrofosmin stress test 04/06/2020: ?No previous exam available for comparison. ?Lexiscan nuclear stress test performed using 1-day protocol. Stress EKG is non-diagnostic, as this is pharmacological stress test. In addition rest and stress EKG showed sinus rhythm, LBBB. ?Stress LVEF 53%. SPECT images showed medium sized, medium intensity, partially reversible perfusion defect in apical anteroseptal myocardium.  ?Intermediate risk study.  ?  ?Lower extremity angiography/intervention 01/2017: ?Angiographic data: Abdominal aorta shows mild atherosclerotic changes. Only the distal abdominal aorta visualized. The right common iliac artery is occluded and fills by collaterals. Right common femoral artery is widely patent. Left iliac artery shows a 40-50% stenosis at ostium, mild disease in the left external iliac artery. Left femoral artery is widely patent. ?  ?Successful PTA and stenting of the bilateral common iliac artery with implantation of 9.0 x 37 mm express LD on the right common iliac artery and 9.0 x 25 mm express LD on the left common iliac artery, stenosis reduced from 100% to 0% on the right and 50% to 0% on the left. ? ?Coronary angiogram 2017 [facility]): ?Severe diffuse  triple-vessel coronary disease, small calibered vessels.   ?Distal circumflex 80%, distal PL branch 80%, ?LAD 90% stenosis, successful angioplasty with placement of 2.25 x 12, 2.25 x 23, 2.5 x 33 and 2.5 x 8 overlapping  Xience DES.  Mid to distal anteroapical and anterior and inferoapical dyskinesis, LVEF 25%. ? ?Recent labs: ?03/11/2022: ?Glucose 161, BUN/Cr 41/1.58. EGFR 34. Na/K 139/5.3.  ? ?02/24/2022: ?Glucose 134, BUN/Cr 44/2.03. EGFR 25. Na/K 137/5.2.   ? ?02/14/2022: ?Glucose 324, BUN/Cr 26/1.47. EGFR 37. Na/K 137/5.6.  ?Chol 147, TG 189, HDL 36, LDL 79 ? ?06/04/2021: ?Glucose 100, BUN/Cr 25/1.32. EGFR 39. Na/K 143/4.8. Rest of the CMP normal ?H/H 10.5/29.9. MCV 89. Platelets 246 ?HbA1C 6.7% ? ? ?Review of Systems  ?Cardiovascular:  Positive for dyspnea on exertion (Improved). Negative for chest pain, leg swelling, palpitations and syncope.  ?Neurological:  Positive for light-headedness.  ? ?   ? ? ?Vitals:  ? 04/05/22 1330  ?BP: (!) 124/54  ?Pulse: 85  ?Resp: 16  ?SpO2: 93%  ? ? ? ?Body mass index is 41.63 kg/m?. ? ? ?Objective:  ? Physical Exam ?Vitals and nursing note reviewed.  ?Constitutional:   ?   Appearance: She is well-developed.  ?Neck:  ?   Vascular: No JVD.  ?Cardiovascular:  ?   Rate and Rhythm: Normal rate and regular rhythm.  ?   Pulses: Intact distal pulses.  ?   Heart sounds: Normal heart sounds. No murmur heard. ?Pulmonary:  ?   Effort: Pulmonary effort is normal.  ?   Breath sounds: Normal breath sounds. No wheezing or rales.  ?Musculoskeletal:  ?   Right lower leg: No edema.  ?   Left lower leg: No edema.  ? ? ? ?  ICD-10-CM   ?1. Essential hypertension  I10 PCV RENAL/RENAL ARTERY DUPLEX COMPLETE  ?  ?2. Coronary artery disease involving native coronary artery of native heart with other form of angina pectoris ([place])  I25.118   ?  ?3. Ischemic cardiomyopathy  I25.5   ?  ?4. Stage 3b chronic kidney disease (HCC)  N18.32 PCV RENAL/RENAL ARTERY DUPLEX COMPLETE  ?  ? ?Orders Placed This Encounter  ?Procedures  ? PCV RENAL/RENAL ARTERY DUPLEX COMPLETE  ? ?   ?Assessment & Recommendations:  ? ?76-y/o Caucasian female with hypertension, uncontrolled type 2 diabetes mellitus, coronary artery disease status post overlapping stents in LAD 2019, ischemic cardiomyopathy with recovered EF 55 to 60% in 2019, PAD status post bilateral iliac stenting 2018, now with AKI/CKD ? ?CKD: ?Creatinine 1.06 (10/2020), 1.47 (02/14/2022), 2.03 (02/24/2022), down to 1.58  (02/2022) off Entresto and chlorthalidone. ?Will check renal artery duplex given sharp increase in Cr recently.  ? ?Ischemic cardiomyopathy: ?Recovered EF.   ?Exertional dyspnea improved since losing weight. ?Holding Entresto and chlorthalidone due to AKI.   ?Continue metoprolol succinate 25 mg daily, prescribed on previous visit. ? ?Hypertension: ?Well controlled on metoprolol succinate 25 mg, amlodipine 5 mg daily. ? ?CAD: ?Primary PCI to LAD in [place] in 2017. ?Continue Aspirin, statin, metoprolol succinate 25 mg daily, amlodipine 5 mg daily. ?No anginal symptoms at this time. ? ?Type II DM: ?BG reportedly 80-100. Continue f/u w/PCP. ? ?She remains reluctant to see nephrology and endocrinology at this time.  We mutually decided to hold off nephrology referral unless creatinine remains elevated on repeat check as of today. ? ?F/u in 3 months ? ? ?Nigel Mormon, MD ?[facility] ?Pager: [phone] ?Office: [phone] ?   ?

## 2022-04-06 ENCOUNTER — Ambulatory Visit: Payer: Medicare Other | Admitting: Cardiology

## 2022-04-22 ENCOUNTER — Other Ambulatory Visit: Payer: Medicare Other

## 2022-04-29 ENCOUNTER — Ambulatory Visit: Payer: Medicare Other

## 2022-04-29 DIAGNOSIS — I700 Atherosclerosis of aorta: Secondary | ICD-10-CM

## 2022-04-29 DIAGNOSIS — I10 Essential (primary) hypertension: Secondary | ICD-10-CM

## 2022-04-29 DIAGNOSIS — N1832 Chronic kidney disease, stage 3b: Secondary | ICD-10-CM

## 2022-05-02 NOTE — Progress Notes (Signed)
Called pt no answer left a vm to call back

## 2022-05-02 NOTE — Progress Notes (Signed)
Pt called back and ws informed about her results. Pt understood

## 2022-05-17 ENCOUNTER — Telehealth: Payer: Self-pay

## 2022-05-17 NOTE — Telephone Encounter (Signed)
Ok

## 2022-07-13 ENCOUNTER — Ambulatory Visit: Payer: Medicare Other | Admitting: Cardiology

## 2022-12-28 DIAGNOSIS — M549 Dorsalgia, unspecified: Secondary | ICD-10-CM | POA: Diagnosis not present

## 2022-12-28 DIAGNOSIS — C50912 Malignant neoplasm of unspecified site of left female breast: Secondary | ICD-10-CM | POA: Diagnosis not present

## 2022-12-28 DIAGNOSIS — Z7689 Persons encountering health services in other specified circumstances: Secondary | ICD-10-CM | POA: Diagnosis not present

## 2022-12-28 DIAGNOSIS — R634 Abnormal weight loss: Secondary | ICD-10-CM | POA: Diagnosis not present

## 2022-12-28 DIAGNOSIS — I10 Essential (primary) hypertension: Secondary | ICD-10-CM | POA: Diagnosis not present

## 2022-12-28 DIAGNOSIS — M25559 Pain in unspecified hip: Secondary | ICD-10-CM | POA: Diagnosis not present

## 2022-12-29 DIAGNOSIS — E1142 Type 2 diabetes mellitus with diabetic polyneuropathy: Secondary | ICD-10-CM | POA: Diagnosis not present

## 2022-12-29 DIAGNOSIS — I10 Essential (primary) hypertension: Secondary | ICD-10-CM | POA: Diagnosis not present

## 2022-12-29 DIAGNOSIS — C50919 Malignant neoplasm of unspecified site of unspecified female breast: Secondary | ICD-10-CM | POA: Diagnosis not present

## 2022-12-29 DIAGNOSIS — G4733 Obstructive sleep apnea (adult) (pediatric): Secondary | ICD-10-CM | POA: Diagnosis not present

## 2022-12-29 DIAGNOSIS — Z170 Estrogen receptor positive status [ER+]: Secondary | ICD-10-CM | POA: Diagnosis not present

## 2022-12-29 DIAGNOSIS — N1832 Chronic kidney disease, stage 3b: Secondary | ICD-10-CM | POA: Diagnosis not present

## 2022-12-29 DIAGNOSIS — M8589 Other specified disorders of bone density and structure, multiple sites: Secondary | ICD-10-CM | POA: Diagnosis not present

## 2022-12-29 DIAGNOSIS — E538 Deficiency of other specified B group vitamins: Secondary | ICD-10-CM | POA: Diagnosis not present

## 2022-12-29 DIAGNOSIS — I5033 Acute on chronic diastolic (congestive) heart failure: Secondary | ICD-10-CM | POA: Diagnosis not present

## 2022-12-29 DIAGNOSIS — C50512 Malignant neoplasm of lower-outer quadrant of left female breast: Secondary | ICD-10-CM | POA: Diagnosis not present

## 2023-01-02 DIAGNOSIS — C50912 Malignant neoplasm of unspecified site of left female breast: Secondary | ICD-10-CM | POA: Diagnosis not present

## 2023-01-02 DIAGNOSIS — M549 Dorsalgia, unspecified: Secondary | ICD-10-CM | POA: Diagnosis not present

## 2023-01-02 DIAGNOSIS — R634 Abnormal weight loss: Secondary | ICD-10-CM | POA: Diagnosis not present

## 2023-01-02 DIAGNOSIS — J189 Pneumonia, unspecified organism: Secondary | ICD-10-CM | POA: Diagnosis not present

## 2023-01-02 DIAGNOSIS — N281 Cyst of kidney, acquired: Secondary | ICD-10-CM | POA: Diagnosis not present

## 2023-01-02 DIAGNOSIS — M25559 Pain in unspecified hip: Secondary | ICD-10-CM | POA: Diagnosis not present

## 2023-01-04 DIAGNOSIS — C50912 Malignant neoplasm of unspecified site of left female breast: Secondary | ICD-10-CM | POA: Diagnosis not present

## 2023-01-05 DIAGNOSIS — C50112 Malignant neoplasm of central portion of left female breast: Secondary | ICD-10-CM | POA: Diagnosis not present

## 2023-01-06 DIAGNOSIS — G4733 Obstructive sleep apnea (adult) (pediatric): Secondary | ICD-10-CM | POA: Diagnosis not present

## 2023-01-06 DIAGNOSIS — N1832 Chronic kidney disease, stage 3b: Secondary | ICD-10-CM | POA: Diagnosis not present

## 2023-01-06 DIAGNOSIS — F339 Major depressive disorder, recurrent, unspecified: Secondary | ICD-10-CM | POA: Diagnosis not present

## 2023-01-06 DIAGNOSIS — I252 Old myocardial infarction: Secondary | ICD-10-CM | POA: Diagnosis not present

## 2023-01-06 DIAGNOSIS — E1122 Type 2 diabetes mellitus with diabetic chronic kidney disease: Secondary | ICD-10-CM | POA: Diagnosis not present

## 2023-01-06 DIAGNOSIS — I2510 Atherosclerotic heart disease of native coronary artery without angina pectoris: Secondary | ICD-10-CM | POA: Diagnosis not present

## 2023-01-06 DIAGNOSIS — E1169 Type 2 diabetes mellitus with other specified complication: Secondary | ICD-10-CM | POA: Diagnosis not present

## 2023-01-06 DIAGNOSIS — J069 Acute upper respiratory infection, unspecified: Secondary | ICD-10-CM | POA: Diagnosis not present

## 2023-01-06 DIAGNOSIS — I739 Peripheral vascular disease, unspecified: Secondary | ICD-10-CM | POA: Diagnosis not present

## 2023-01-06 DIAGNOSIS — G2581 Restless legs syndrome: Secondary | ICD-10-CM | POA: Diagnosis not present

## 2023-01-06 DIAGNOSIS — I5030 Unspecified diastolic (congestive) heart failure: Secondary | ICD-10-CM | POA: Diagnosis not present

## 2023-01-06 DIAGNOSIS — D8481 Immunodeficiency due to conditions classified elsewhere: Secondary | ICD-10-CM | POA: Diagnosis not present

## 2023-01-09 DIAGNOSIS — N952 Postmenopausal atrophic vaginitis: Secondary | ICD-10-CM | POA: Diagnosis not present

## 2023-01-09 DIAGNOSIS — E119 Type 2 diabetes mellitus without complications: Secondary | ICD-10-CM | POA: Diagnosis not present

## 2023-01-13 DIAGNOSIS — R918 Other nonspecific abnormal finding of lung field: Secondary | ICD-10-CM | POA: Diagnosis not present

## 2023-01-13 DIAGNOSIS — I2510 Atherosclerotic heart disease of native coronary artery without angina pectoris: Secondary | ICD-10-CM | POA: Diagnosis not present

## 2023-01-13 DIAGNOSIS — N183 Chronic kidney disease, stage 3 unspecified: Secondary | ICD-10-CM | POA: Diagnosis not present

## 2023-01-13 DIAGNOSIS — E1159 Type 2 diabetes mellitus with other circulatory complications: Secondary | ICD-10-CM | POA: Diagnosis not present

## 2023-01-13 DIAGNOSIS — E1122 Type 2 diabetes mellitus with diabetic chronic kidney disease: Secondary | ICD-10-CM | POA: Diagnosis not present

## 2023-01-13 DIAGNOSIS — Z794 Long term (current) use of insulin: Secondary | ICD-10-CM | POA: Diagnosis not present

## 2023-01-13 DIAGNOSIS — E1142 Type 2 diabetes mellitus with diabetic polyneuropathy: Secondary | ICD-10-CM | POA: Diagnosis not present

## 2023-01-13 DIAGNOSIS — E213 Hyperparathyroidism, unspecified: Secondary | ICD-10-CM | POA: Diagnosis not present

## 2023-01-13 DIAGNOSIS — E1151 Type 2 diabetes mellitus with diabetic peripheral angiopathy without gangrene: Secondary | ICD-10-CM | POA: Diagnosis not present

## 2023-01-13 DIAGNOSIS — G2581 Restless legs syndrome: Secondary | ICD-10-CM | POA: Diagnosis not present

## 2023-01-20 DIAGNOSIS — G2581 Restless legs syndrome: Secondary | ICD-10-CM | POA: Diagnosis not present

## 2023-01-20 DIAGNOSIS — E1140 Type 2 diabetes mellitus with diabetic neuropathy, unspecified: Secondary | ICD-10-CM | POA: Diagnosis not present

## 2023-01-20 DIAGNOSIS — E1151 Type 2 diabetes mellitus with diabetic peripheral angiopathy without gangrene: Secondary | ICD-10-CM | POA: Diagnosis not present

## 2023-01-20 DIAGNOSIS — N183 Chronic kidney disease, stage 3 unspecified: Secondary | ICD-10-CM | POA: Diagnosis not present

## 2023-01-20 DIAGNOSIS — E1122 Type 2 diabetes mellitus with diabetic chronic kidney disease: Secondary | ICD-10-CM | POA: Diagnosis not present

## 2023-01-20 DIAGNOSIS — C50912 Malignant neoplasm of unspecified site of left female breast: Secondary | ICD-10-CM | POA: Diagnosis not present

## 2023-01-20 DIAGNOSIS — I2510 Atherosclerotic heart disease of native coronary artery without angina pectoris: Secondary | ICD-10-CM | POA: Diagnosis not present

## 2023-01-23 ENCOUNTER — Other Ambulatory Visit: Payer: Self-pay | Admitting: Cardiology

## 2023-01-25 DIAGNOSIS — I10 Essential (primary) hypertension: Secondary | ICD-10-CM | POA: Diagnosis not present

## 2023-01-25 DIAGNOSIS — G4733 Obstructive sleep apnea (adult) (pediatric): Secondary | ICD-10-CM | POA: Diagnosis not present

## 2023-01-25 DIAGNOSIS — Z170 Estrogen receptor positive status [ER+]: Secondary | ICD-10-CM | POA: Diagnosis not present

## 2023-01-25 DIAGNOSIS — C50512 Malignant neoplasm of lower-outer quadrant of left female breast: Secondary | ICD-10-CM | POA: Diagnosis not present

## 2023-01-27 DIAGNOSIS — I2510 Atherosclerotic heart disease of native coronary artery without angina pectoris: Secondary | ICD-10-CM | POA: Diagnosis not present

## 2023-01-27 DIAGNOSIS — E1122 Type 2 diabetes mellitus with diabetic chronic kidney disease: Secondary | ICD-10-CM | POA: Diagnosis not present

## 2023-01-27 DIAGNOSIS — N183 Chronic kidney disease, stage 3 unspecified: Secondary | ICD-10-CM | POA: Diagnosis not present

## 2023-01-27 DIAGNOSIS — E1169 Type 2 diabetes mellitus with other specified complication: Secondary | ICD-10-CM | POA: Diagnosis not present

## 2023-01-27 DIAGNOSIS — C50912 Malignant neoplasm of unspecified site of left female breast: Secondary | ICD-10-CM | POA: Diagnosis not present

## 2023-01-27 DIAGNOSIS — I739 Peripheral vascular disease, unspecified: Secondary | ICD-10-CM | POA: Diagnosis not present

## 2023-01-27 DIAGNOSIS — E113212 Type 2 diabetes mellitus with mild nonproliferative diabetic retinopathy with macular edema, left eye: Secondary | ICD-10-CM | POA: Diagnosis not present

## 2023-01-27 DIAGNOSIS — F331 Major depressive disorder, recurrent, moderate: Secondary | ICD-10-CM | POA: Diagnosis not present

## 2023-01-27 DIAGNOSIS — Z7984 Long term (current) use of oral hypoglycemic drugs: Secondary | ICD-10-CM | POA: Diagnosis not present

## 2023-01-27 DIAGNOSIS — E1140 Type 2 diabetes mellitus with diabetic neuropathy, unspecified: Secondary | ICD-10-CM | POA: Diagnosis not present

## 2023-01-31 DIAGNOSIS — C50812 Malignant neoplasm of overlapping sites of left female breast: Secondary | ICD-10-CM | POA: Diagnosis not present

## 2023-01-31 DIAGNOSIS — Z882 Allergy status to sulfonamides status: Secondary | ICD-10-CM | POA: Diagnosis not present

## 2023-01-31 DIAGNOSIS — Z87891 Personal history of nicotine dependence: Secondary | ICD-10-CM | POA: Diagnosis not present

## 2023-01-31 DIAGNOSIS — Z794 Long term (current) use of insulin: Secondary | ICD-10-CM | POA: Diagnosis not present

## 2023-01-31 DIAGNOSIS — Z885 Allergy status to narcotic agent status: Secondary | ICD-10-CM | POA: Diagnosis not present

## 2023-01-31 DIAGNOSIS — C50912 Malignant neoplasm of unspecified site of left female breast: Secondary | ICD-10-CM | POA: Diagnosis not present

## 2023-01-31 DIAGNOSIS — D0512 Intraductal carcinoma in situ of left breast: Secondary | ICD-10-CM | POA: Diagnosis not present

## 2023-01-31 DIAGNOSIS — G4733 Obstructive sleep apnea (adult) (pediatric): Secondary | ICD-10-CM | POA: Diagnosis not present

## 2023-01-31 DIAGNOSIS — I509 Heart failure, unspecified: Secondary | ICD-10-CM | POA: Diagnosis not present

## 2023-01-31 DIAGNOSIS — Z79899 Other long term (current) drug therapy: Secondary | ICD-10-CM | POA: Diagnosis not present

## 2023-01-31 DIAGNOSIS — Z888 Allergy status to other drugs, medicaments and biological substances status: Secondary | ICD-10-CM | POA: Diagnosis not present

## 2023-01-31 DIAGNOSIS — D0502 Lobular carcinoma in situ of left breast: Secondary | ICD-10-CM | POA: Diagnosis not present

## 2023-01-31 DIAGNOSIS — E1142 Type 2 diabetes mellitus with diabetic polyneuropathy: Secondary | ICD-10-CM | POA: Diagnosis not present

## 2023-01-31 DIAGNOSIS — Z6841 Body Mass Index (BMI) 40.0 and over, adult: Secondary | ICD-10-CM | POA: Diagnosis not present

## 2023-01-31 DIAGNOSIS — N1832 Chronic kidney disease, stage 3b: Secondary | ICD-10-CM | POA: Diagnosis not present

## 2023-01-31 DIAGNOSIS — Z7982 Long term (current) use of aspirin: Secondary | ICD-10-CM | POA: Diagnosis not present

## 2023-01-31 DIAGNOSIS — I130 Hypertensive heart and chronic kidney disease with heart failure and stage 1 through stage 4 chronic kidney disease, or unspecified chronic kidney disease: Secondary | ICD-10-CM | POA: Diagnosis not present

## 2023-01-31 DIAGNOSIS — G4730 Sleep apnea, unspecified: Secondary | ICD-10-CM | POA: Diagnosis not present

## 2023-01-31 DIAGNOSIS — I252 Old myocardial infarction: Secondary | ICD-10-CM | POA: Diagnosis not present

## 2023-01-31 DIAGNOSIS — E1122 Type 2 diabetes mellitus with diabetic chronic kidney disease: Secondary | ICD-10-CM | POA: Diagnosis not present

## 2023-01-31 DIAGNOSIS — Z170 Estrogen receptor positive status [ER+]: Secondary | ICD-10-CM | POA: Diagnosis not present

## 2023-01-31 DIAGNOSIS — I5033 Acute on chronic diastolic (congestive) heart failure: Secondary | ICD-10-CM | POA: Diagnosis not present

## 2023-02-08 DIAGNOSIS — Z9889 Other specified postprocedural states: Secondary | ICD-10-CM | POA: Diagnosis not present

## 2023-02-09 DIAGNOSIS — Z170 Estrogen receptor positive status [ER+]: Secondary | ICD-10-CM | POA: Diagnosis not present

## 2023-02-09 DIAGNOSIS — F3289 Other specified depressive episodes: Secondary | ICD-10-CM | POA: Diagnosis not present

## 2023-02-09 DIAGNOSIS — G4733 Obstructive sleep apnea (adult) (pediatric): Secondary | ICD-10-CM | POA: Diagnosis not present

## 2023-02-09 DIAGNOSIS — M8589 Other specified disorders of bone density and structure, multiple sites: Secondary | ICD-10-CM | POA: Diagnosis not present

## 2023-02-09 DIAGNOSIS — C50512 Malignant neoplasm of lower-outer quadrant of left female breast: Secondary | ICD-10-CM | POA: Diagnosis not present

## 2023-02-09 DIAGNOSIS — N1832 Chronic kidney disease, stage 3b: Secondary | ICD-10-CM | POA: Diagnosis not present

## 2023-02-09 DIAGNOSIS — E538 Deficiency of other specified B group vitamins: Secondary | ICD-10-CM | POA: Diagnosis not present

## 2023-02-09 DIAGNOSIS — I10 Essential (primary) hypertension: Secondary | ICD-10-CM | POA: Diagnosis not present

## 2023-02-14 DIAGNOSIS — I255 Ischemic cardiomyopathy: Secondary | ICD-10-CM | POA: Diagnosis not present

## 2023-02-14 DIAGNOSIS — I739 Peripheral vascular disease, unspecified: Secondary | ICD-10-CM | POA: Diagnosis not present

## 2023-02-14 DIAGNOSIS — F3340 Major depressive disorder, recurrent, in remission, unspecified: Secondary | ICD-10-CM | POA: Diagnosis not present

## 2023-02-14 DIAGNOSIS — I2510 Atherosclerotic heart disease of native coronary artery without angina pectoris: Secondary | ICD-10-CM | POA: Diagnosis not present

## 2023-02-14 DIAGNOSIS — Z170 Estrogen receptor positive status [ER+]: Secondary | ICD-10-CM | POA: Diagnosis not present

## 2023-02-14 DIAGNOSIS — C50512 Malignant neoplasm of lower-outer quadrant of left female breast: Secondary | ICD-10-CM | POA: Diagnosis not present

## 2023-02-14 DIAGNOSIS — I10 Essential (primary) hypertension: Secondary | ICD-10-CM | POA: Diagnosis not present

## 2023-02-14 DIAGNOSIS — G2581 Restless legs syndrome: Secondary | ICD-10-CM | POA: Diagnosis not present

## 2023-02-14 DIAGNOSIS — E785 Hyperlipidemia, unspecified: Secondary | ICD-10-CM | POA: Diagnosis not present

## 2023-02-14 DIAGNOSIS — E1142 Type 2 diabetes mellitus with diabetic polyneuropathy: Secondary | ICD-10-CM | POA: Diagnosis not present

## 2023-02-22 DIAGNOSIS — Z9889 Other specified postprocedural states: Secondary | ICD-10-CM | POA: Diagnosis not present

## 2023-03-06 ENCOUNTER — Other Ambulatory Visit: Payer: Self-pay | Admitting: Cardiology

## 2023-03-28 DIAGNOSIS — I2510 Atherosclerotic heart disease of native coronary artery without angina pectoris: Secondary | ICD-10-CM | POA: Diagnosis not present

## 2023-03-28 DIAGNOSIS — I10 Essential (primary) hypertension: Secondary | ICD-10-CM | POA: Diagnosis not present

## 2023-03-28 DIAGNOSIS — E1165 Type 2 diabetes mellitus with hyperglycemia: Secondary | ICD-10-CM | POA: Diagnosis not present

## 2023-03-28 DIAGNOSIS — E1142 Type 2 diabetes mellitus with diabetic polyneuropathy: Secondary | ICD-10-CM | POA: Diagnosis not present

## 2023-03-28 DIAGNOSIS — E785 Hyperlipidemia, unspecified: Secondary | ICD-10-CM | POA: Diagnosis not present

## 2023-03-28 DIAGNOSIS — M545 Low back pain, unspecified: Secondary | ICD-10-CM | POA: Diagnosis not present

## 2023-03-28 DIAGNOSIS — N1832 Chronic kidney disease, stage 3b: Secondary | ICD-10-CM | POA: Diagnosis not present

## 2023-03-28 DIAGNOSIS — G8929 Other chronic pain: Secondary | ICD-10-CM | POA: Diagnosis not present

## 2023-03-28 DIAGNOSIS — E559 Vitamin D deficiency, unspecified: Secondary | ICD-10-CM | POA: Diagnosis not present

## 2023-03-28 DIAGNOSIS — G2581 Restless legs syndrome: Secondary | ICD-10-CM | POA: Diagnosis not present

## 2023-03-28 DIAGNOSIS — F3340 Major depressive disorder, recurrent, in remission, unspecified: Secondary | ICD-10-CM | POA: Diagnosis not present

## 2023-03-29 ENCOUNTER — Other Ambulatory Visit: Payer: Self-pay

## 2023-04-13 ENCOUNTER — Other Ambulatory Visit: Payer: Self-pay

## 2023-04-13 ENCOUNTER — Ambulatory Visit: Admitting: Cardiology

## 2023-04-13 MED ORDER — METOPROLOL SUCCINATE ER 25 MG PO TB24: 25.0000 mg | ORAL_TABLET | Freq: Every day | ORAL | 0 refills | Status: DC

## 2023-05-05 DIAGNOSIS — I10 Essential (primary) hypertension: Secondary | ICD-10-CM | POA: Diagnosis not present

## 2023-05-05 DIAGNOSIS — E538 Deficiency of other specified B group vitamins: Secondary | ICD-10-CM | POA: Diagnosis not present

## 2023-05-05 DIAGNOSIS — Z170 Estrogen receptor positive status [ER+]: Secondary | ICD-10-CM | POA: Diagnosis not present

## 2023-05-05 DIAGNOSIS — C50512 Malignant neoplasm of lower-outer quadrant of left female breast: Secondary | ICD-10-CM | POA: Diagnosis not present

## 2023-05-05 DIAGNOSIS — N1832 Chronic kidney disease, stage 3b: Secondary | ICD-10-CM | POA: Diagnosis not present

## 2023-05-05 DIAGNOSIS — I255 Ischemic cardiomyopathy: Secondary | ICD-10-CM | POA: Diagnosis not present

## 2023-05-05 DIAGNOSIS — M8589 Other specified disorders of bone density and structure, multiple sites: Secondary | ICD-10-CM | POA: Diagnosis not present

## 2023-05-05 DIAGNOSIS — G4733 Obstructive sleep apnea (adult) (pediatric): Secondary | ICD-10-CM | POA: Diagnosis not present

## 2023-05-05 DIAGNOSIS — E1165 Type 2 diabetes mellitus with hyperglycemia: Secondary | ICD-10-CM | POA: Diagnosis not present

## 2023-05-05 DIAGNOSIS — F3340 Major depressive disorder, recurrent, in remission, unspecified: Secondary | ICD-10-CM | POA: Diagnosis not present

## 2023-05-05 DIAGNOSIS — I2510 Atherosclerotic heart disease of native coronary artery without angina pectoris: Secondary | ICD-10-CM | POA: Diagnosis not present

## 2023-05-11 DIAGNOSIS — M85852 Other specified disorders of bone density and structure, left thigh: Secondary | ICD-10-CM | POA: Diagnosis not present

## 2023-05-11 DIAGNOSIS — Z79811 Long term (current) use of aromatase inhibitors: Secondary | ICD-10-CM | POA: Diagnosis not present

## 2023-05-11 DIAGNOSIS — M8589 Other specified disorders of bone density and structure, multiple sites: Secondary | ICD-10-CM | POA: Diagnosis not present

## 2023-05-16 DIAGNOSIS — M8580 Other specified disorders of bone density and structure, unspecified site: Secondary | ICD-10-CM | POA: Diagnosis not present

## 2023-05-16 DIAGNOSIS — C50512 Malignant neoplasm of lower-outer quadrant of left female breast: Secondary | ICD-10-CM | POA: Diagnosis not present

## 2023-05-16 DIAGNOSIS — Z170 Estrogen receptor positive status [ER+]: Secondary | ICD-10-CM | POA: Diagnosis not present

## 2023-05-16 DIAGNOSIS — E538 Deficiency of other specified B group vitamins: Secondary | ICD-10-CM | POA: Diagnosis not present

## 2023-05-17 DIAGNOSIS — E538 Deficiency of other specified B group vitamins: Secondary | ICD-10-CM | POA: Diagnosis not present

## 2023-05-18 DIAGNOSIS — E538 Deficiency of other specified B group vitamins: Secondary | ICD-10-CM | POA: Diagnosis not present

## 2023-05-19 ENCOUNTER — Ambulatory Visit: Admitting: Cardiology

## 2023-05-19 DIAGNOSIS — E538 Deficiency of other specified B group vitamins: Secondary | ICD-10-CM | POA: Diagnosis not present

## 2023-05-23 DIAGNOSIS — E538 Deficiency of other specified B group vitamins: Secondary | ICD-10-CM | POA: Diagnosis not present

## 2023-05-30 DIAGNOSIS — E538 Deficiency of other specified B group vitamins: Secondary | ICD-10-CM | POA: Diagnosis not present

## 2023-06-05 ENCOUNTER — Ambulatory Visit: Payer: Medicare HMO | Admitting: Cardiology

## 2023-06-05 ENCOUNTER — Encounter: Payer: Self-pay | Admitting: Cardiology

## 2023-06-05 VITALS — BP 118/55 | HR 75 | Resp 14 | Ht 61.0 in | Wt 208.8 lb

## 2023-06-05 DIAGNOSIS — I10 Essential (primary) hypertension: Secondary | ICD-10-CM

## 2023-06-05 DIAGNOSIS — I739 Peripheral vascular disease, unspecified: Secondary | ICD-10-CM

## 2023-06-05 DIAGNOSIS — I25118 Atherosclerotic heart disease of native coronary artery with other forms of angina pectoris: Secondary | ICD-10-CM | POA: Diagnosis not present

## 2023-06-05 MED ORDER — CILOSTAZOL 50 MG PO TABS
50.0000 mg | ORAL_TABLET | Freq: Two times a day (BID) | ORAL | 3 refills | Status: DC
Start: 2023-06-05 — End: 2023-10-16

## 2023-06-05 NOTE — Progress Notes (Signed)
Right  Follow up visit  Subjective:   Christine Levy, female    DOB: [birth year], 77 y.o.   MRN: [id number]   HPI   Chief Complaint  Patient presents with   Hypertension   Follow-up   77 y/o Caucasian female with hypertension, uncontrolled type 2 diabetes mellitus, coronary artery disease status post overlapping stents in LAD 2019, ischemic cardiomyopathy with recovered EF 55 to 60% in 2019, PAD status post bilateral iliac stenting 2018, CKD 3b  Patient was last seen by me in 02/2022. Since then, she had breast cancer-underwent mastectomy. She did not have any cardiac issues through her surgery. She is awaiting reconstruction surgery. From cardiac standpoint, she has not had any angina, dyspnea symptoms. She does report heartburn, but completely unrelated to exertion. She does report bilateral leg craps with minimal exertion-such as biking a few hundred feet.   She plans to return to [place] tomorrow, and be back in  in 09/2023.   Current Outpatient Medications:    amLODipine (NORVASC) 5 MG tablet, Take 1 tablet (5 mg total) by mouth daily., Disp: 90 tablet, Rfl: 3   aspirin EC 81 MG tablet, Take 1 tablet (81 mg total) by mouth daily., Disp: 90 tablet, Rfl: 3   atorvastatin (LIPITOR) 40 MG tablet, TAKE TWO TABLETS BY MOUTH EVERY DAY IN THE EVENING, Disp: 180 tablet, Rfl: 1   Dulaglutide 1.5 MG/0.5ML SOPN, Inject 1.5 mg into the skin every 7 (seven) days., Disp: , Rfl:    insulin glargine (LANTUS) 100 UNIT/ML injection, Inject 20 Units into the skin 2 (two) times daily. , Disp: , Rfl:    Magnesium Citrate 100 MG TABS, Take 1 tablet by mouth in the morning and at bedtime., Disp: , Rfl:    metoprolol succinate (TOPROL XL) 25 MG 24 hr tablet, Take 1 tablet (25 mg total) by mouth daily., Disp: 90 tablet, Rfl: 0   nitroGLYCERIN (NITROSTAT) 0.4 MG SL tablet, Place 1 tablet (0.4 mg total) under the tongue every 5 (five) minutes as needed for chest pain., Disp: 30 tablet, Rfl: 2   PARoxetine (PAXIL)  10 MG tablet, Take 10 mg by mouth daily., Disp: , Rfl:    Probiotic Product (PROBIOTIC PO), Take 1 tablet by mouth daily., Disp: , Rfl:    rOPINIRole (REQUIP) 2 MG tablet, Take 3 mg by mouth at bedtime. , Disp: , Rfl:   Cardiovascular & other pertient studies:  Renal artery duplex 04/29/2022: No evidence of renal artery occlusive disease in either renal artery. Normal sized kidneys bilaterally. Mild increase in resistivity index bilaterally suggests medico-renal disease. Diffuse plaque noted in the abdominal aorta.   Echocardiogram 02/15/2022: Normal LV systolic function with EF 60%. Left ventricle cavity is normal in size. Moderate concentric hypertrophy of the left ventricle. Normal global wall motion. Normal diastolic filling pattern, normal LAP.  Mild tricuspid regurgitation. No evidence of pulmonary hypertension. Compared to study 10/19/2020 MR and PR have improved and mild PTHN is not noted on current study.   EKG 02/10/2022: Sinus rhythm 83 bpm Left bundle branch block  Lexiscan Tetrofosmin stress test 04/06/2020: No previous exam available for comparison. Lexiscan nuclear stress test performed using 1-day protocol. Stress EKG is non-diagnostic, as this is pharmacological stress test. In addition rest and stress EKG showed sinus rhythm, LBBB. Stress LVEF 53%. SPECT images showed medium sized, medium intensity, partially reversible perfusion defect in apical anteroseptal myocardium.  Intermediate risk study.    Lower extremity angiography/intervention 01/2017: Angiographic data: Abdominal aorta shows mild atherosclerotic  changes. Only the distal abdominal aorta visualized. The right common iliac artery is occluded and fills by collaterals. Right common femoral artery is widely patent. Left iliac artery shows a 40-50% stenosis at ostium, mild disease in the left external iliac artery. Left femoral artery is widely patent.   Successful PTA and stenting of the bilateral common iliac  artery with implantation of 9.0 x 37 mm express LD on the right common iliac artery and 9.0 x 25 mm express LD on the left common iliac artery, stenosis reduced from 100% to 0% on the right and 50% to 0% on the left.  Coronary angiogram 2017 [facility]): Severe diffuse triple-vessel coronary disease, small calibered vessels.   Distal circumflex 80%, distal PL branch 80%, LAD 90% stenosis, successful angioplasty with placement of 2.25 x 12, 2.25 x 23, 2.5 x 33 and 2.5 x 8 overlapping  Xience DES.  Mid to distal anteroapical and anterior and inferoapical dyskinesis, LVEF 25%.  Recent labs: 05/05/2023: Glucose 97, BUN/Cr 20/1.48. EGFR 36. Na/K 143/4.0. Rest of the CMP normal H/H 12/36. MCV 89. Platelets 265 HbA1C 9.9%    Review of Systems  Cardiovascular:  Positive for claudication. Negative for chest pain, dyspnea on exertion, leg swelling, palpitations and syncope.  Neurological:  Positive for light-headedness.         Vitals:   06/05/23 0903  BP: (!) 118/55  Pulse: 75  Resp: 14  SpO2: 99%     Body mass index is 39.45 kg/m.   Objective:   Physical Exam Vitals and nursing note reviewed.  Constitutional:      General: She is not in acute distress.    Appearance: She is well-developed.  Neck:     Vascular: No JVD.  Cardiovascular:     Rate and Rhythm: Normal rate and regular rhythm.     Pulses: Intact distal pulses.          Dorsalis pedis pulses are 1+ on the right side and 0 on the left side.       Posterior tibial pulses are 1+ on the right side and 0 on the left side.     Heart sounds: Normal heart sounds. No murmur heard.    Comments: No resting leg iscehmia Pulmonary:     Effort: Pulmonary effort is normal.     Breath sounds: Normal breath sounds. No wheezing or rales.  Musculoskeletal:     Right lower leg: No edema.     Left lower leg: No edema.        ICD-10-CM   1. Coronary artery disease involving native coronary artery of native heart with other form of  angina pectoris (HCC)  I25.118     2. Essential hypertension  I10     3. Claudication in peripheral vascular disease (HCC)  I73.9 cilostazol (PLETAL) 50 MG tablet    Lipid panel    PCV LOWER ARTERIAL (BILATERAL)    CANCELED: PCV LOWER ARTERIAL (BILATERAL)     Orders Placed This Encounter  Procedures   Lipid panel   PCV LOWER ARTERIAL (BILATERAL)      Assessment & Recommendations:   77 y/o Caucasian female with hypertension, uncontrolled type 2 diabetes mellitus, coronary artery disease status post overlapping stents in LAD 2019, ischemic cardiomyopathy with recovered EF 55 to 60% in 2019, PAD status post bilateral iliac stenting 2018, CKD 3b  PAD:  H/o b/l iliac stents. Lifestyle limiting claudication symptoms. No resting leg ischemia.  Continue Aspirin, statin. Check lipid panel. Added Pletal 50  mg bid. Recommend regular walking. Will check LE arterial duplex in October when patient returns. If symptoms no better by then, could consider revascularization.  Hypertension: Controlled  CAD: Primary PCI to LAD in [place] in 2017. Continue Aspirin, statin, metoprolol succinate 25 mg daily, amlodipine 5 mg daily. No anginal symptoms at this time.  Type II DM: Reportedly has had hypoglycemia episodes. Recommend f/u w/diabetes specialist.  F/u in 09/2023.    Elder Negus, MD Pager: [phone] Office: [phone]

## 2023-06-06 DIAGNOSIS — E538 Deficiency of other specified B group vitamins: Secondary | ICD-10-CM | POA: Diagnosis not present

## 2023-06-13 DIAGNOSIS — E538 Deficiency of other specified B group vitamins: Secondary | ICD-10-CM | POA: Diagnosis not present

## 2023-06-20 DIAGNOSIS — E538 Deficiency of other specified B group vitamins: Secondary | ICD-10-CM | POA: Diagnosis not present

## 2023-07-05 DIAGNOSIS — M79675 Pain in left toe(s): Secondary | ICD-10-CM | POA: Diagnosis not present

## 2023-07-05 DIAGNOSIS — M2012 Hallux valgus (acquired), left foot: Secondary | ICD-10-CM | POA: Diagnosis not present

## 2023-07-05 DIAGNOSIS — M7732 Calcaneal spur, left foot: Secondary | ICD-10-CM | POA: Diagnosis not present

## 2023-07-05 DIAGNOSIS — M19072 Primary osteoarthritis, left ankle and foot: Secondary | ICD-10-CM | POA: Diagnosis not present

## 2023-07-07 DIAGNOSIS — M79675 Pain in left toe(s): Secondary | ICD-10-CM | POA: Diagnosis not present

## 2023-07-07 DIAGNOSIS — M66279 Spontaneous rupture of extensor tendons, unspecified ankle and foot: Secondary | ICD-10-CM | POA: Diagnosis not present

## 2023-07-14 DIAGNOSIS — S90112A Contusion of left great toe without damage to nail, initial encounter: Secondary | ICD-10-CM | POA: Diagnosis not present

## 2023-07-14 DIAGNOSIS — M66372 Spontaneous rupture of flexor tendons, left ankle and foot: Secondary | ICD-10-CM | POA: Diagnosis not present

## 2023-07-18 DIAGNOSIS — M19072 Primary osteoarthritis, left ankle and foot: Secondary | ICD-10-CM | POA: Diagnosis not present

## 2023-07-18 DIAGNOSIS — M79675 Pain in left toe(s): Secondary | ICD-10-CM | POA: Diagnosis not present

## 2023-07-18 DIAGNOSIS — M79672 Pain in left foot: Secondary | ICD-10-CM | POA: Diagnosis not present

## 2023-07-18 DIAGNOSIS — M66279 Spontaneous rupture of extensor tendons, unspecified ankle and foot: Secondary | ICD-10-CM | POA: Diagnosis not present

## 2023-07-18 DIAGNOSIS — M2012 Hallux valgus (acquired), left foot: Secondary | ICD-10-CM | POA: Diagnosis not present

## 2023-07-18 DIAGNOSIS — E1142 Type 2 diabetes mellitus with diabetic polyneuropathy: Secondary | ICD-10-CM | POA: Diagnosis not present

## 2023-07-20 DIAGNOSIS — E538 Deficiency of other specified B group vitamins: Secondary | ICD-10-CM | POA: Diagnosis not present

## 2023-07-25 ENCOUNTER — Other Ambulatory Visit: Payer: Self-pay | Admitting: Cardiology

## 2023-07-25 DIAGNOSIS — I10 Essential (primary) hypertension: Secondary | ICD-10-CM

## 2023-08-08 DIAGNOSIS — E119 Type 2 diabetes mellitus without complications: Secondary | ICD-10-CM | POA: Diagnosis not present

## 2023-08-08 DIAGNOSIS — H33192 Other retinoschisis and retinal cysts, left eye: Secondary | ICD-10-CM | POA: Diagnosis not present

## 2023-08-08 DIAGNOSIS — H04123 Dry eye syndrome of bilateral lacrimal glands: Secondary | ICD-10-CM | POA: Diagnosis not present

## 2023-08-08 DIAGNOSIS — Z961 Presence of intraocular lens: Secondary | ICD-10-CM | POA: Diagnosis not present

## 2023-08-17 DIAGNOSIS — E559 Vitamin D deficiency, unspecified: Secondary | ICD-10-CM | POA: Diagnosis not present

## 2023-08-17 DIAGNOSIS — E1151 Type 2 diabetes mellitus with diabetic peripheral angiopathy without gangrene: Secondary | ICD-10-CM | POA: Diagnosis not present

## 2023-08-17 DIAGNOSIS — I10 Essential (primary) hypertension: Secondary | ICD-10-CM | POA: Diagnosis not present

## 2023-08-17 DIAGNOSIS — E538 Deficiency of other specified B group vitamins: Secondary | ICD-10-CM | POA: Diagnosis not present

## 2023-08-17 DIAGNOSIS — E782 Mixed hyperlipidemia: Secondary | ICD-10-CM | POA: Diagnosis not present

## 2023-08-21 DIAGNOSIS — I2510 Atherosclerotic heart disease of native coronary artery without angina pectoris: Secondary | ICD-10-CM | POA: Diagnosis not present

## 2023-08-21 DIAGNOSIS — G4733 Obstructive sleep apnea (adult) (pediatric): Secondary | ICD-10-CM | POA: Diagnosis not present

## 2023-08-21 DIAGNOSIS — Z0000 Encounter for general adult medical examination without abnormal findings: Secondary | ICD-10-CM | POA: Diagnosis not present

## 2023-08-21 DIAGNOSIS — E1151 Type 2 diabetes mellitus with diabetic peripheral angiopathy without gangrene: Secondary | ICD-10-CM | POA: Diagnosis not present

## 2023-08-21 DIAGNOSIS — I255 Ischemic cardiomyopathy: Secondary | ICD-10-CM | POA: Diagnosis not present

## 2023-08-21 DIAGNOSIS — I739 Peripheral vascular disease, unspecified: Secondary | ICD-10-CM | POA: Diagnosis not present

## 2023-08-21 DIAGNOSIS — E782 Mixed hyperlipidemia: Secondary | ICD-10-CM | POA: Diagnosis not present

## 2023-08-21 DIAGNOSIS — E538 Deficiency of other specified B group vitamins: Secondary | ICD-10-CM | POA: Diagnosis not present

## 2023-08-21 DIAGNOSIS — I10 Essential (primary) hypertension: Secondary | ICD-10-CM | POA: Diagnosis not present

## 2023-09-05 DIAGNOSIS — G4733 Obstructive sleep apnea (adult) (pediatric): Secondary | ICD-10-CM | POA: Diagnosis not present

## 2023-09-20 DIAGNOSIS — E538 Deficiency of other specified B group vitamins: Secondary | ICD-10-CM | POA: Diagnosis not present

## 2023-09-26 DIAGNOSIS — M2012 Hallux valgus (acquired), left foot: Secondary | ICD-10-CM | POA: Diagnosis not present

## 2023-09-26 DIAGNOSIS — E1142 Type 2 diabetes mellitus with diabetic polyneuropathy: Secondary | ICD-10-CM | POA: Diagnosis not present

## 2023-10-10 ENCOUNTER — Other Ambulatory Visit

## 2023-10-11 DIAGNOSIS — E785 Hyperlipidemia, unspecified: Secondary | ICD-10-CM | POA: Diagnosis not present

## 2023-10-11 DIAGNOSIS — C50112 Malignant neoplasm of central portion of left female breast: Secondary | ICD-10-CM | POA: Diagnosis not present

## 2023-10-11 DIAGNOSIS — I2510 Atherosclerotic heart disease of native coronary artery without angina pectoris: Secondary | ICD-10-CM | POA: Diagnosis not present

## 2023-10-11 DIAGNOSIS — I739 Peripheral vascular disease, unspecified: Secondary | ICD-10-CM | POA: Diagnosis not present

## 2023-10-11 DIAGNOSIS — F3340 Major depressive disorder, recurrent, in remission, unspecified: Secondary | ICD-10-CM | POA: Diagnosis not present

## 2023-10-11 DIAGNOSIS — G2581 Restless legs syndrome: Secondary | ICD-10-CM | POA: Diagnosis not present

## 2023-10-11 DIAGNOSIS — N1831 Chronic kidney disease, stage 3a: Secondary | ICD-10-CM | POA: Diagnosis not present

## 2023-10-11 DIAGNOSIS — I10 Essential (primary) hypertension: Secondary | ICD-10-CM | POA: Diagnosis not present

## 2023-10-11 DIAGNOSIS — E1122 Type 2 diabetes mellitus with diabetic chronic kidney disease: Secondary | ICD-10-CM | POA: Diagnosis not present

## 2023-10-16 ENCOUNTER — Encounter: Payer: Self-pay | Admitting: Cardiology

## 2023-10-16 ENCOUNTER — Ambulatory Visit: Payer: Medicare HMO | Attending: Cardiology | Admitting: Cardiology

## 2023-10-16 VITALS — BP 132/70 | HR 78 | Resp 16 | Ht 61.0 in | Wt 206.0 lb

## 2023-10-16 DIAGNOSIS — I10 Essential (primary) hypertension: Secondary | ICD-10-CM

## 2023-10-16 DIAGNOSIS — I25118 Atherosclerotic heart disease of native coronary artery with other forms of angina pectoris: Secondary | ICD-10-CM

## 2023-10-16 NOTE — Progress Notes (Signed)
Cardiology Office Note:  .   Date:  10/16/2023  ID:  Christine Levy, DOB [birth year], MRN [id number] PCP: Christine Hake, MD  [facility] Providers Cardiologist:  Christine Mainland, MD PCP: Christine Hake, MD  Chief Complaint  Patient presents with   Coronary artery disease involving native coronary artery of   Essential hypertension   Follow-up           History of Present Illness: .    Christine Levy is a 77 y.o. female with hypertension, uncontrolled type 2 diabetes mellitus, coronary artery disease status post overlapping stents in LAD 2019, ischemic cardiomyopathy with recovered EF 55 to 60% in 2019, PAD status post bilateral iliac stenting 2018, CKD 3b.  Patient is doing well, denies chest pain, shortness of breath, palpitations, leg edema, orthopnea, PND, TIA/syncope. Labs from her recent PCP visit in [place] below. She did not tolerate Pletal due to diarrhea.    Vitals:   10/16/23 1529  BP: 132/70  Pulse: 78  Resp: 16  SpO2: 93%     ROS:  Review of Systems  Cardiovascular:  Negative for chest pain, dyspnea on exertion, leg swelling, palpitations and syncope.     Studies Reviewed: Marland Kitchen       EKG 10/16/2023: Normal sinus rhythm Left bundle branch block When compared with ECG of 14-Nov-2020 22:22, Premature ventricular complexes are no longer Present   Labs 07/2023: A1C 6.4% Cr 1.07, eGFR 53 Chol 94, TG 94, HDL 33, LDL 42  Physical Exam:   Physical Exam Vitals and nursing note reviewed.  Constitutional:      General: She is not in acute distress. Neck:     Vascular: No JVD.  Cardiovascular:     Rate and Rhythm: Normal rate and regular rhythm.     Heart sounds: Normal heart sounds. No murmur heard. Pulmonary:     Effort: Pulmonary effort is normal.     Breath sounds: Normal breath sounds. No wheezing or rales.  Musculoskeletal:     Right lower leg: No edema.     Left lower leg: No edema.      VISIT DIAGNOSES:   ICD-10-CM   1. Coronary  artery disease involving native coronary artery of native heart with other form of angina pectoris (HCC)  I25.118 EKG 12-Lead    2. Essential hypertension  I10        ASSESSMENT AND PLAN: .    Christine Levy is a 77 y.o. female with hypertension, uncontrolled type 2 diabetes mellitus, coronary artery disease status post overlapping stents in LAD 2019, ischemic cardiomyopathy with recovered EF 55 to 60% in 2019, PAD status post bilateral iliac stenting 2018, CKD 3b   PAD:  H/o b/l iliac stents. Controled claudications symptoms. No resting leg ischemia.  Continue Aspirin, statin. Lipids very well controled. Recommend regular walking.   Hypertension: Controlled   CAD: Primary PCI to LAD in [place] in 2017. Continue Aspirin, statin, metoprolol succinate 25 mg daily, amlodipine 5 mg daily. No anginal symptoms at this time.   Type II DM: Well controlled.   F/u in 1 year  Signed, Christine Negus, MD

## 2023-10-16 NOTE — Patient Instructions (Signed)
Medication Instructions:   STOP TAKING PLETAL NOW  STOP TAKING MAGNESIUM CITRATE NOW  *If you need a refill on your cardiac medications before your next appointment, please call your pharmacy*     Follow-Up: At [facility], you and your health needs are our priority.  As part of our continuing mission to provide you with exceptional heart care, we have created designated Provider Care Teams.  These Care Teams include your primary Cardiologist (physician) and Advanced Practice Providers (APPs -  Physician Assistants and Nurse Practitioners) who all work together to provide you with the care you need, when you need it.  We recommend signing up for the patient portal called "MyChart".  Sign up information is provided on this After Visit Summary.  MyChart is used to connect with patients for Virtual Visits (Telemedicine).  Patients are able to view lab/test results, encounter notes, upcoming appointments, etc.  Non-urgent messages can be sent to your provider as well.   To learn more about what you can do with MyChart, go to [email or web address].    Your next appointment:   1 year(s)  Provider:   DR. Rosemary Holms

## 2023-10-25 DIAGNOSIS — I10 Essential (primary) hypertension: Secondary | ICD-10-CM | POA: Diagnosis not present

## 2023-10-25 DIAGNOSIS — N611 Abscess of the breast and nipple: Secondary | ICD-10-CM | POA: Diagnosis not present

## 2023-11-10 DIAGNOSIS — Z08 Encounter for follow-up examination after completed treatment for malignant neoplasm: Secondary | ICD-10-CM | POA: Diagnosis not present

## 2023-11-10 DIAGNOSIS — Z9889 Other specified postprocedural states: Secondary | ICD-10-CM | POA: Diagnosis not present

## 2023-11-10 DIAGNOSIS — R92323 Mammographic fibroglandular density, bilateral breasts: Secondary | ICD-10-CM | POA: Diagnosis not present

## 2023-11-10 DIAGNOSIS — R928 Other abnormal and inconclusive findings on diagnostic imaging of breast: Secondary | ICD-10-CM | POA: Diagnosis not present

## 2023-11-10 DIAGNOSIS — Z853 Personal history of malignant neoplasm of breast: Secondary | ICD-10-CM | POA: Diagnosis not present

## 2023-11-15 DIAGNOSIS — N1831 Chronic kidney disease, stage 3a: Secondary | ICD-10-CM | POA: Diagnosis not present

## 2023-11-15 DIAGNOSIS — M8580 Other specified disorders of bone density and structure, unspecified site: Secondary | ICD-10-CM | POA: Diagnosis not present

## 2023-11-15 DIAGNOSIS — G4733 Obstructive sleep apnea (adult) (pediatric): Secondary | ICD-10-CM | POA: Diagnosis not present

## 2023-11-15 DIAGNOSIS — Z9189 Other specified personal risk factors, not elsewhere classified: Secondary | ICD-10-CM | POA: Diagnosis not present

## 2023-11-15 DIAGNOSIS — I2510 Atherosclerotic heart disease of native coronary artery without angina pectoris: Secondary | ICD-10-CM | POA: Diagnosis not present

## 2023-11-15 DIAGNOSIS — E538 Deficiency of other specified B group vitamins: Secondary | ICD-10-CM | POA: Diagnosis not present

## 2023-11-15 DIAGNOSIS — I255 Ischemic cardiomyopathy: Secondary | ICD-10-CM | POA: Diagnosis not present

## 2023-11-15 DIAGNOSIS — F3340 Major depressive disorder, recurrent, in remission, unspecified: Secondary | ICD-10-CM | POA: Diagnosis not present

## 2023-11-15 DIAGNOSIS — I10 Essential (primary) hypertension: Secondary | ICD-10-CM | POA: Diagnosis not present

## 2023-11-15 DIAGNOSIS — C50512 Malignant neoplasm of lower-outer quadrant of left female breast: Secondary | ICD-10-CM | POA: Diagnosis not present

## 2023-11-15 DIAGNOSIS — E1122 Type 2 diabetes mellitus with diabetic chronic kidney disease: Secondary | ICD-10-CM | POA: Diagnosis not present

## 2023-11-16 DIAGNOSIS — E66812 Obesity, class 2: Secondary | ICD-10-CM | POA: Diagnosis not present

## 2023-11-16 DIAGNOSIS — M8580 Other specified disorders of bone density and structure, unspecified site: Secondary | ICD-10-CM | POA: Diagnosis not present

## 2023-11-16 DIAGNOSIS — G2581 Restless legs syndrome: Secondary | ICD-10-CM | POA: Diagnosis not present

## 2023-11-16 DIAGNOSIS — Z6837 Body mass index (BMI) 37.0-37.9, adult: Secondary | ICD-10-CM | POA: Diagnosis not present

## 2023-11-16 DIAGNOSIS — I255 Ischemic cardiomyopathy: Secondary | ICD-10-CM | POA: Diagnosis not present

## 2023-11-16 DIAGNOSIS — Z01818 Encounter for other preprocedural examination: Secondary | ICD-10-CM | POA: Diagnosis not present

## 2023-11-16 DIAGNOSIS — E1122 Type 2 diabetes mellitus with diabetic chronic kidney disease: Secondary | ICD-10-CM | POA: Diagnosis not present

## 2023-11-16 DIAGNOSIS — I2510 Atherosclerotic heart disease of native coronary artery without angina pectoris: Secondary | ICD-10-CM | POA: Diagnosis not present

## 2023-11-16 DIAGNOSIS — F3340 Major depressive disorder, recurrent, in remission, unspecified: Secondary | ICD-10-CM | POA: Diagnosis not present

## 2023-11-16 DIAGNOSIS — I739 Peripheral vascular disease, unspecified: Secondary | ICD-10-CM | POA: Diagnosis not present

## 2023-11-16 DIAGNOSIS — N1831 Chronic kidney disease, stage 3a: Secondary | ICD-10-CM | POA: Diagnosis not present

## 2023-11-29 DIAGNOSIS — I255 Ischemic cardiomyopathy: Secondary | ICD-10-CM | POA: Diagnosis not present

## 2023-11-29 DIAGNOSIS — N6459 Other signs and symptoms in breast: Secondary | ICD-10-CM | POA: Diagnosis not present

## 2023-11-29 DIAGNOSIS — Z853 Personal history of malignant neoplasm of breast: Secondary | ICD-10-CM | POA: Diagnosis not present

## 2023-11-29 DIAGNOSIS — E1122 Type 2 diabetes mellitus with diabetic chronic kidney disease: Secondary | ICD-10-CM | POA: Diagnosis not present

## 2023-11-29 DIAGNOSIS — I252 Old myocardial infarction: Secondary | ICD-10-CM | POA: Diagnosis not present

## 2023-11-29 DIAGNOSIS — N6489 Other specified disorders of breast: Secondary | ICD-10-CM | POA: Diagnosis not present

## 2023-11-29 DIAGNOSIS — N1831 Chronic kidney disease, stage 3a: Secondary | ICD-10-CM | POA: Diagnosis not present

## 2023-11-29 DIAGNOSIS — I2510 Atherosclerotic heart disease of native coronary artery without angina pectoris: Secondary | ICD-10-CM | POA: Diagnosis not present

## 2023-11-29 DIAGNOSIS — G4733 Obstructive sleep apnea (adult) (pediatric): Secondary | ICD-10-CM | POA: Diagnosis not present

## 2023-11-29 DIAGNOSIS — E785 Hyperlipidemia, unspecified: Secondary | ICD-10-CM | POA: Diagnosis not present

## 2023-11-29 DIAGNOSIS — N62 Hypertrophy of breast: Secondary | ICD-10-CM | POA: Diagnosis not present

## 2023-11-29 DIAGNOSIS — I129 Hypertensive chronic kidney disease with stage 1 through stage 4 chronic kidney disease, or unspecified chronic kidney disease: Secondary | ICD-10-CM | POA: Diagnosis not present

## 2023-11-29 DIAGNOSIS — J449 Chronic obstructive pulmonary disease, unspecified: Secondary | ICD-10-CM | POA: Diagnosis not present

## 2024-01-12 DIAGNOSIS — G4733 Obstructive sleep apnea (adult) (pediatric): Secondary | ICD-10-CM | POA: Diagnosis not present

## 2024-02-13 DIAGNOSIS — G4733 Obstructive sleep apnea (adult) (pediatric): Secondary | ICD-10-CM | POA: Diagnosis not present

## 2024-02-23 DIAGNOSIS — N1832 Chronic kidney disease, stage 3b: Secondary | ICD-10-CM | POA: Diagnosis not present

## 2024-02-29 DIAGNOSIS — N1831 Chronic kidney disease, stage 3a: Secondary | ICD-10-CM | POA: Diagnosis not present

## 2024-02-29 DIAGNOSIS — Z1330 Encounter for screening examination for mental health and behavioral disorders, unspecified: Secondary | ICD-10-CM | POA: Diagnosis not present

## 2024-02-29 DIAGNOSIS — E785 Hyperlipidemia, unspecified: Secondary | ICD-10-CM | POA: Diagnosis not present

## 2024-02-29 DIAGNOSIS — E1122 Type 2 diabetes mellitus with diabetic chronic kidney disease: Secondary | ICD-10-CM | POA: Diagnosis not present

## 2024-02-29 DIAGNOSIS — G2581 Restless legs syndrome: Secondary | ICD-10-CM | POA: Diagnosis not present

## 2024-02-29 DIAGNOSIS — Z170 Estrogen receptor positive status [ER+]: Secondary | ICD-10-CM | POA: Diagnosis not present

## 2024-02-29 DIAGNOSIS — M8580 Other specified disorders of bone density and structure, unspecified site: Secondary | ICD-10-CM | POA: Diagnosis not present

## 2024-02-29 DIAGNOSIS — I2510 Atherosclerotic heart disease of native coronary artery without angina pectoris: Secondary | ICD-10-CM | POA: Diagnosis not present

## 2024-02-29 DIAGNOSIS — F3340 Major depressive disorder, recurrent, in remission, unspecified: Secondary | ICD-10-CM | POA: Diagnosis not present

## 2024-02-29 DIAGNOSIS — I10 Essential (primary) hypertension: Secondary | ICD-10-CM | POA: Diagnosis not present

## 2024-02-29 DIAGNOSIS — C50512 Malignant neoplasm of lower-outer quadrant of left female breast: Secondary | ICD-10-CM | POA: Diagnosis not present

## 2024-03-12 DIAGNOSIS — G4733 Obstructive sleep apnea (adult) (pediatric): Secondary | ICD-10-CM | POA: Diagnosis not present

## 2024-04-03 DIAGNOSIS — Z853 Personal history of malignant neoplasm of breast: Secondary | ICD-10-CM | POA: Diagnosis not present

## 2024-04-03 DIAGNOSIS — L910 Hypertrophic scar: Secondary | ICD-10-CM | POA: Diagnosis not present

## 2024-04-12 DIAGNOSIS — G4733 Obstructive sleep apnea (adult) (pediatric): Secondary | ICD-10-CM | POA: Diagnosis not present

## 2024-04-23 DIAGNOSIS — Z170 Estrogen receptor positive status [ER+]: Secondary | ICD-10-CM | POA: Diagnosis not present

## 2024-04-23 DIAGNOSIS — C50512 Malignant neoplasm of lower-outer quadrant of left female breast: Secondary | ICD-10-CM | POA: Diagnosis not present

## 2024-04-23 DIAGNOSIS — I10 Essential (primary) hypertension: Secondary | ICD-10-CM | POA: Diagnosis not present

## 2024-05-01 DIAGNOSIS — C50512 Malignant neoplasm of lower-outer quadrant of left female breast: Secondary | ICD-10-CM | POA: Diagnosis not present

## 2024-05-01 DIAGNOSIS — I10 Essential (primary) hypertension: Secondary | ICD-10-CM | POA: Diagnosis not present

## 2024-05-01 DIAGNOSIS — Z170 Estrogen receptor positive status [ER+]: Secondary | ICD-10-CM | POA: Diagnosis not present

## 2024-05-01 DIAGNOSIS — L989 Disorder of the skin and subcutaneous tissue, unspecified: Secondary | ICD-10-CM | POA: Diagnosis not present

## 2024-05-01 DIAGNOSIS — M85852 Other specified disorders of bone density and structure, left thigh: Secondary | ICD-10-CM | POA: Diagnosis not present

## 2024-05-01 DIAGNOSIS — N1831 Chronic kidney disease, stage 3a: Secondary | ICD-10-CM | POA: Diagnosis not present

## 2024-05-01 DIAGNOSIS — E538 Deficiency of other specified B group vitamins: Secondary | ICD-10-CM | POA: Diagnosis not present

## 2024-05-01 DIAGNOSIS — D649 Anemia, unspecified: Secondary | ICD-10-CM | POA: Diagnosis not present

## 2024-05-07 DIAGNOSIS — Z01419 Encounter for gynecological examination (general) (routine) without abnormal findings: Secondary | ICD-10-CM | POA: Diagnosis not present

## 2024-05-07 DIAGNOSIS — Z124 Encounter for screening for malignant neoplasm of cervix: Secondary | ICD-10-CM | POA: Diagnosis not present

## 2024-05-07 DIAGNOSIS — B3731 Acute candidiasis of vulva and vagina: Secondary | ICD-10-CM | POA: Diagnosis not present

## 2024-05-07 DIAGNOSIS — N760 Acute vaginitis: Secondary | ICD-10-CM | POA: Diagnosis not present

## 2024-05-08 DIAGNOSIS — D485 Neoplasm of uncertain behavior of skin: Secondary | ICD-10-CM | POA: Diagnosis not present

## 2024-05-08 DIAGNOSIS — L814 Other melanin hyperpigmentation: Secondary | ICD-10-CM | POA: Diagnosis not present

## 2024-05-12 DIAGNOSIS — G4733 Obstructive sleep apnea (adult) (pediatric): Secondary | ICD-10-CM | POA: Diagnosis not present

## 2024-05-15 DIAGNOSIS — Z170 Estrogen receptor positive status [ER+]: Secondary | ICD-10-CM | POA: Diagnosis not present

## 2024-05-15 DIAGNOSIS — C50512 Malignant neoplasm of lower-outer quadrant of left female breast: Secondary | ICD-10-CM | POA: Diagnosis not present

## 2024-05-17 DIAGNOSIS — Z170 Estrogen receptor positive status [ER+]: Secondary | ICD-10-CM | POA: Diagnosis not present

## 2024-05-17 DIAGNOSIS — R921 Mammographic calcification found on diagnostic imaging of breast: Secondary | ICD-10-CM | POA: Diagnosis not present

## 2024-05-17 DIAGNOSIS — R92322 Mammographic fibroglandular density, left breast: Secondary | ICD-10-CM | POA: Diagnosis not present

## 2024-05-17 DIAGNOSIS — C50512 Malignant neoplasm of lower-outer quadrant of left female breast: Secondary | ICD-10-CM | POA: Diagnosis not present

## 2024-05-24 DIAGNOSIS — M85852 Other specified disorders of bone density and structure, left thigh: Secondary | ICD-10-CM | POA: Diagnosis not present

## 2024-05-24 DIAGNOSIS — C50512 Malignant neoplasm of lower-outer quadrant of left female breast: Secondary | ICD-10-CM | POA: Diagnosis not present

## 2024-05-24 DIAGNOSIS — Z170 Estrogen receptor positive status [ER+]: Secondary | ICD-10-CM | POA: Diagnosis not present

## 2024-06-12 DIAGNOSIS — L814 Other melanin hyperpigmentation: Secondary | ICD-10-CM | POA: Diagnosis not present

## 2024-06-12 DIAGNOSIS — G4733 Obstructive sleep apnea (adult) (pediatric): Secondary | ICD-10-CM | POA: Diagnosis not present

## 2024-07-12 DIAGNOSIS — G4733 Obstructive sleep apnea (adult) (pediatric): Secondary | ICD-10-CM | POA: Diagnosis not present

## 2024-08-12 DIAGNOSIS — H33192 Other retinoschisis and retinal cysts, left eye: Secondary | ICD-10-CM | POA: Diagnosis not present

## 2024-08-12 DIAGNOSIS — H5213 Myopia, bilateral: Secondary | ICD-10-CM | POA: Diagnosis not present

## 2024-08-12 DIAGNOSIS — G4733 Obstructive sleep apnea (adult) (pediatric): Secondary | ICD-10-CM | POA: Diagnosis not present

## 2024-08-12 DIAGNOSIS — H524 Presbyopia: Secondary | ICD-10-CM | POA: Diagnosis not present

## 2024-09-12 DIAGNOSIS — Z0000 Encounter for general adult medical examination without abnormal findings: Secondary | ICD-10-CM | POA: Diagnosis not present

## 2024-09-12 DIAGNOSIS — I10 Essential (primary) hypertension: Secondary | ICD-10-CM | POA: Diagnosis not present

## 2024-09-12 DIAGNOSIS — E782 Mixed hyperlipidemia: Secondary | ICD-10-CM | POA: Diagnosis not present

## 2024-09-12 DIAGNOSIS — I255 Ischemic cardiomyopathy: Secondary | ICD-10-CM | POA: Diagnosis not present

## 2024-09-12 DIAGNOSIS — I2510 Atherosclerotic heart disease of native coronary artery without angina pectoris: Secondary | ICD-10-CM | POA: Diagnosis not present

## 2024-09-12 DIAGNOSIS — G4733 Obstructive sleep apnea (adult) (pediatric): Secondary | ICD-10-CM | POA: Diagnosis not present

## 2024-09-12 DIAGNOSIS — I739 Peripheral vascular disease, unspecified: Secondary | ICD-10-CM | POA: Diagnosis not present

## 2024-09-12 DIAGNOSIS — G4739 Other sleep apnea: Secondary | ICD-10-CM | POA: Diagnosis not present

## 2024-09-12 DIAGNOSIS — Z6837 Body mass index (BMI) 37.0-37.9, adult: Secondary | ICD-10-CM | POA: Diagnosis not present

## 2024-09-12 DIAGNOSIS — I5021 Acute systolic (congestive) heart failure: Secondary | ICD-10-CM | POA: Diagnosis not present

## 2024-09-12 DIAGNOSIS — E538 Deficiency of other specified B group vitamins: Secondary | ICD-10-CM | POA: Diagnosis not present

## 2024-09-12 DIAGNOSIS — E1151 Type 2 diabetes mellitus with diabetic peripheral angiopathy without gangrene: Secondary | ICD-10-CM | POA: Diagnosis not present

## 2024-10-08 DIAGNOSIS — N644 Mastodynia: Secondary | ICD-10-CM | POA: Diagnosis not present

## 2024-10-08 DIAGNOSIS — I10 Essential (primary) hypertension: Secondary | ICD-10-CM | POA: Diagnosis not present

## 2024-10-10 DIAGNOSIS — R921 Mammographic calcification found on diagnostic imaging of breast: Secondary | ICD-10-CM | POA: Diagnosis not present

## 2024-10-10 DIAGNOSIS — R92323 Mammographic fibroglandular density, bilateral breasts: Secondary | ICD-10-CM | POA: Diagnosis not present

## 2024-10-10 DIAGNOSIS — N644 Mastodynia: Secondary | ICD-10-CM | POA: Diagnosis not present

## 2024-10-10 DIAGNOSIS — Z853 Personal history of malignant neoplasm of breast: Secondary | ICD-10-CM | POA: Diagnosis not present

## 2024-10-16 DIAGNOSIS — N1831 Chronic kidney disease, stage 3a: Secondary | ICD-10-CM | POA: Diagnosis not present

## 2024-10-16 DIAGNOSIS — I10 Essential (primary) hypertension: Secondary | ICD-10-CM | POA: Diagnosis not present

## 2024-10-16 DIAGNOSIS — E1159 Type 2 diabetes mellitus with other circulatory complications: Secondary | ICD-10-CM | POA: Diagnosis not present

## 2024-10-16 DIAGNOSIS — Z170 Estrogen receptor positive status [ER+]: Secondary | ICD-10-CM | POA: Diagnosis not present

## 2024-10-16 DIAGNOSIS — I152 Hypertension secondary to endocrine disorders: Secondary | ICD-10-CM | POA: Diagnosis not present

## 2024-10-16 DIAGNOSIS — F3340 Major depressive disorder, recurrent, in remission, unspecified: Secondary | ICD-10-CM | POA: Diagnosis not present

## 2024-10-16 DIAGNOSIS — E1169 Type 2 diabetes mellitus with other specified complication: Secondary | ICD-10-CM | POA: Diagnosis not present

## 2024-10-16 DIAGNOSIS — C50512 Malignant neoplasm of lower-outer quadrant of left female breast: Secondary | ICD-10-CM | POA: Diagnosis not present

## 2024-10-16 DIAGNOSIS — E1122 Type 2 diabetes mellitus with diabetic chronic kidney disease: Secondary | ICD-10-CM | POA: Diagnosis not present

## 2024-10-16 DIAGNOSIS — E785 Hyperlipidemia, unspecified: Secondary | ICD-10-CM | POA: Diagnosis not present

## 2024-10-16 DIAGNOSIS — M85852 Other specified disorders of bone density and structure, left thigh: Secondary | ICD-10-CM | POA: Diagnosis not present

## 2024-10-16 DIAGNOSIS — G2581 Restless legs syndrome: Secondary | ICD-10-CM | POA: Diagnosis not present

## 2024-11-12 DIAGNOSIS — G4733 Obstructive sleep apnea (adult) (pediatric): Secondary | ICD-10-CM | POA: Diagnosis not present

## 2024-11-18 DIAGNOSIS — Z170 Estrogen receptor positive status [ER+]: Secondary | ICD-10-CM | POA: Diagnosis not present

## 2024-11-18 DIAGNOSIS — C50512 Malignant neoplasm of lower-outer quadrant of left female breast: Secondary | ICD-10-CM | POA: Diagnosis not present

## 2024-11-18 DIAGNOSIS — E1159 Type 2 diabetes mellitus with other circulatory complications: Secondary | ICD-10-CM | POA: Diagnosis not present

## 2024-11-18 DIAGNOSIS — I152 Hypertension secondary to endocrine disorders: Secondary | ICD-10-CM | POA: Diagnosis not present

## 2024-11-18 DIAGNOSIS — M85852 Other specified disorders of bone density and structure, left thigh: Secondary | ICD-10-CM | POA: Diagnosis not present

## 2024-11-18 DIAGNOSIS — E538 Deficiency of other specified B group vitamins: Secondary | ICD-10-CM | POA: Diagnosis not present

## 2024-12-12 DIAGNOSIS — G4733 Obstructive sleep apnea (adult) (pediatric): Secondary | ICD-10-CM | POA: Diagnosis not present
